# Patient Record
Sex: Male | Born: 1960
Health system: Southern US, Community
[De-identification: ages and names within clinical notes are randomized; demographics above are authoritative.]

## PROBLEM LIST (undated history)

## (undated) DIAGNOSIS — J45909 Unspecified asthma, uncomplicated: Secondary | ICD-10-CM

## (undated) DIAGNOSIS — K649 Unspecified hemorrhoids: Secondary | ICD-10-CM

## (undated) DIAGNOSIS — I10 Essential (primary) hypertension: Secondary | ICD-10-CM

## (undated) DIAGNOSIS — J302 Other seasonal allergic rhinitis: Secondary | ICD-10-CM

## (undated) DIAGNOSIS — K219 Gastro-esophageal reflux disease without esophagitis: Secondary | ICD-10-CM

## (undated) HISTORY — PX: TONSILLECTOMY: SUR1361

## (undated) HISTORY — PX: HEMORRHOID BANDING: SHX5850

## (undated) HISTORY — DX: Gastro-esophageal reflux disease without esophagitis: K21.9

## (undated) HISTORY — PX: COLONOSCOPY: SHX174

## (undated) HISTORY — DX: Essential (primary) hypertension: I10

## (undated) HISTORY — DX: Unspecified hemorrhoids: K64.9

---

## 2012-06-24 ENCOUNTER — Emergency Department: Payer: Self-pay | Admitting: Emergency Medicine

## 2015-11-01 ENCOUNTER — Other Ambulatory Visit: Payer: Self-pay | Admitting: Family Medicine

## 2015-11-01 DIAGNOSIS — M25461 Effusion, right knee: Secondary | ICD-10-CM

## 2015-11-03 ENCOUNTER — Ambulatory Visit
Admission: RE | Admit: 2015-11-03 | Discharge: 2015-11-03 | Disposition: A | Discharge status: Home / Self Care | Payer: BLUE CROSS/BLUE SHIELD | Source: Ambulatory Visit | Attending: Family Medicine | Admitting: Family Medicine

## 2015-11-03 DIAGNOSIS — M25469 Effusion, unspecified knee: Secondary | ICD-10-CM | POA: Diagnosis present

## 2015-11-03 DIAGNOSIS — M25461 Effusion, right knee: Secondary | ICD-10-CM

## 2015-12-26 ENCOUNTER — Encounter: Payer: Self-pay | Admitting: *Deleted

## 2015-12-26 ENCOUNTER — Other Ambulatory Visit: Payer: BLUE CROSS/BLUE SHIELD

## 2015-12-26 NOTE — Patient Instructions (Signed)
  Your procedure is scheduled on: 12-29-15 Report to Same Day Surgery 2nd floor medical mall To find out your arrival time please call [phone] between 1PM - 3PM on 12-28-15  Remember: Instructions that are not followed completely may result in serious medical risk, up to and including death, or upon the discretion of your surgeon and anesthesiologist your surgery may need to be rescheduled.    _x___ 1. Do not eat food or drink liquids after midnight. No gum chewing or hard candies.     __x__ 2. No Alcohol for 24 hours before or after surgery.   __x__3. No Smoking for 24 prior to surgery.   ____  4. Bring all medications with you on the day of surgery if instructed.    __x__ 5. Notify your doctor if there is any change in your medical condition     (cold, fever, infections).     Do not wear jewelry, make-up, hairpins, clips or nail polish.  Do not wear lotions, powders, or perfumes. You may wear deodorant.  Do not shave 48 hours prior to surgery. Men may shave face and neck.  Do not bring valuables to the hospital.    [facility] is not responsible for any belongings or valuables.               Contacts, dentures or bridgework may not be worn into surgery.  Leave your suitcase in the car. After surgery it may be brought to your room.  For patients admitted to the hospital, discharge time is determined by your treatment team.   Patients discharged the day of surgery will not be allowed to drive home.    Please read over the following fact sheets that you were given:   [facility] Preparing for Surgery and or MRSA Information   ____ Take these medicines the morning of surgery with A SIP OF WATER:    1. NONE  2.  3.  4.  5.  6.  ____ Fleet Enema (as directed)   ____ Use CHG Soap or sage wipes as directed on instruction sheet   ____ Use inhalers on the day of surgery and bring to hospital day of surgery  ____ Stop metformin 2 days prior to surgery    ____ Take 1/2 of  usual insulin dose the night before surgery and none on the morning of surgery.   ____ Stop aspirin or coumadin, or plavix  _x__ Stop Anti-inflammatories such as MELCOXICAM, Advil, Aleve, Ibuprofen, Motrin, Naproxen,          Naprosyn, Goodies powders or aspirin products NOW.  Ok to take Tylenol.   ____ Stop supplements until after surgery.    ____ Bring C-Pap to the hospital.

## 2015-12-29 ENCOUNTER — Ambulatory Visit: Payer: BLUE CROSS/BLUE SHIELD | Admitting: Anesthesiology

## 2015-12-29 ENCOUNTER — Ambulatory Visit
Admission: RE | Admit: 2015-12-29 | Discharge: 2015-12-29 | Disposition: A | Discharge status: Home / Self Care | Payer: BLUE CROSS/BLUE SHIELD | Source: Ambulatory Visit | Attending: Orthopedic Surgery | Admitting: Orthopedic Surgery

## 2015-12-29 ENCOUNTER — Encounter
Admission: RE | Disposition: A | Discharge status: Home / Self Care | Payer: Self-pay | Source: Ambulatory Visit | Attending: Orthopedic Surgery

## 2015-12-29 DIAGNOSIS — Z791 Long term (current) use of non-steroidal anti-inflammatories (NSAID): Secondary | ICD-10-CM | POA: Insufficient documentation

## 2015-12-29 DIAGNOSIS — M1990 Unspecified osteoarthritis, unspecified site: Secondary | ICD-10-CM | POA: Diagnosis not present

## 2015-12-29 DIAGNOSIS — Z8709 Personal history of other diseases of the respiratory system: Secondary | ICD-10-CM | POA: Diagnosis not present

## 2015-12-29 DIAGNOSIS — Z8049 Family history of malignant neoplasm of other genital organs: Secondary | ICD-10-CM | POA: Insufficient documentation

## 2015-12-29 DIAGNOSIS — Z9889 Other specified postprocedural states: Secondary | ICD-10-CM | POA: Diagnosis not present

## 2015-12-29 DIAGNOSIS — M7041 Prepatellar bursitis, right knee: Secondary | ICD-10-CM | POA: Diagnosis not present

## 2015-12-29 HISTORY — DX: Unspecified asthma, uncomplicated: J45.909

## 2015-12-29 HISTORY — PX: KNEE BURSECTOMY: SHX5882

## 2015-12-29 HISTORY — DX: Other seasonal allergic rhinitis: J30.2

## 2015-12-29 SURGERY — BURSECTOMY, KNEE
Anesthesia: General | Laterality: Right | Wound class: Clean

## 2015-12-29 MED ORDER — BUPIVACAINE HCL (PF) 0.5 % IJ SOLN
INTRAMUSCULAR | Status: AC
  Filled 2015-12-29: qty 30

## 2015-12-29 MED ORDER — LACTATED RINGERS IV SOLN
INTRAVENOUS | Status: DC | PRN
  Administered 2015-12-29: 13:00:00 via INTRAVENOUS

## 2015-12-29 MED ORDER — HYDROCODONE-ACETAMINOPHEN 5-325 MG PO TABS: 1.0000 | ORAL_TABLET | Freq: Four times a day (QID) | ORAL | Status: DC | PRN

## 2015-12-29 MED ORDER — CEFAZOLIN IN D5W 1 GM/50ML IV SOLN
INTRAVENOUS | Status: AC
  Filled 2015-12-29: qty 50

## 2015-12-29 MED ORDER — FENTANYL CITRATE (PF) 100 MCG/2ML IJ SOLN
INTRAMUSCULAR | Status: AC
  Filled 2015-12-29: qty 2

## 2015-12-29 MED ORDER — CEFAZOLIN IN D5W 1 GM/50ML IV SOLN
1.0000 g | Freq: Once | INTRAVENOUS | Status: DC
Start: 2015-12-29 — End: 2015-12-29

## 2015-12-29 MED ORDER — LACTATED RINGERS IV SOLN
INTRAVENOUS | Status: DC
  Administered 2015-12-29: 12:00:00 via INTRAVENOUS

## 2015-12-29 MED ORDER — PROPOFOL 10 MG/ML IV BOLUS
INTRAVENOUS | Status: DC | PRN
  Administered 2015-12-29: 50 mg via INTRAVENOUS
  Administered 2015-12-29: 200 mg via INTRAVENOUS

## 2015-12-29 MED ORDER — ONDANSETRON HCL 4 MG/2ML IJ SOLN
INTRAMUSCULAR | Status: DC | PRN
  Administered 2015-12-29: 4 mg via INTRAVENOUS

## 2015-12-29 MED ORDER — FENTANYL CITRATE (PF) 100 MCG/2ML IJ SOLN
INTRAMUSCULAR | Status: DC | PRN
  Administered 2015-12-29 (×2): 50 ug via INTRAVENOUS

## 2015-12-29 MED ORDER — PROMETHAZINE HCL 25 MG/ML IJ SOLN
6.2500 mg | INTRAMUSCULAR | Status: DC | PRN
Start: 2015-12-29 — End: 2015-12-29

## 2015-12-29 MED ORDER — HYDROCODONE-ACETAMINOPHEN 5-325 MG PO TABS
1.0000 | ORAL_TABLET | Freq: Four times a day (QID) | ORAL | Status: DC | PRN
Start: 2015-12-29 — End: 2015-12-29
  Administered 2015-12-29: 1 via ORAL

## 2015-12-29 MED ORDER — MIDAZOLAM HCL 2 MG/2ML IJ SOLN
INTRAMUSCULAR | Status: DC | PRN
  Administered 2015-12-29: 2 mg via INTRAVENOUS

## 2015-12-29 MED ORDER — FAMOTIDINE 20 MG PO TABS
20.0000 mg | ORAL_TABLET | Freq: Once | ORAL | Status: AC
  Administered 2015-12-29: 20 mg via ORAL

## 2015-12-29 MED ORDER — FENTANYL CITRATE (PF) 100 MCG/2ML IJ SOLN
25.0000 ug | INTRAMUSCULAR | Status: DC | PRN
  Administered 2015-12-29 (×3): 50 ug via INTRAVENOUS

## 2015-12-29 MED ORDER — FAMOTIDINE 20 MG PO TABS
ORAL_TABLET | ORAL | Status: AC
  Administered 2015-12-29: 20 mg via ORAL
  Filled 2015-12-29: qty 1

## 2015-12-29 MED ORDER — HYDROCODONE-ACETAMINOPHEN 5-325 MG PO TABS
ORAL_TABLET | ORAL | Status: AC
  Filled 2015-12-29: qty 1

## 2015-12-29 SURGICAL SUPPLY — 41 items
BANDAGE ELASTIC 6 LF NS (GAUZE/BANDAGES/DRESSINGS) ×2 IMPLANT
CANISTER SUCT 1200ML W/VALVE (MISCELLANEOUS) ×2 IMPLANT
CATH IV ANGIO 16GX3.25 GREY (CATHETERS) ×1 IMPLANT
CHLORAPREP W/TINT 26ML (MISCELLANEOUS) ×4 IMPLANT
CUFF TOURN 24 STER (MISCELLANEOUS) IMPLANT
CUFF TOURN 30 STER DUAL PORT (MISCELLANEOUS) ×2 IMPLANT
DRAPE C-ARM XRAY 36X54 (DRAPES) IMPLANT
DRAPE C-ARMOR (DRAPES) IMPLANT
ELECT CAUTERY BLADE 6.4 (BLADE) ×2 IMPLANT
ELECT REM PT RETURN 9FT ADLT (ELECTROSURGICAL)
ELECTRODE REM PT RTRN 9FT ADLT (ELECTROSURGICAL) IMPLANT
GAUZE PETRO XEROFOAM 1X8 (MISCELLANEOUS) ×2 IMPLANT
GAUZE SPONGE 4X4 12PLY STRL (GAUZE/BANDAGES/DRESSINGS) ×2 IMPLANT
GLOVE BIOGEL PI IND STRL 9 (GLOVE) ×1 IMPLANT
GLOVE BIOGEL PI INDICATOR 9 (GLOVE) ×1
GLOVE SURG ORTHO 9.0 STRL STRW (GLOVE) ×2 IMPLANT
GOWN SRG 2XL LVL 4 RGLN SLV (GOWNS) ×1 IMPLANT
GOWN STRL NON-REIN 2XL LVL4 (GOWNS) ×1
GOWN STRL REUS W/ TWL LRG LVL3 (GOWN DISPOSABLE) ×1 IMPLANT
GOWN STRL REUS W/TWL LRG LVL3 (GOWN DISPOSABLE) ×1
HANDLE YANKAUER SUCT BULB TIP (MISCELLANEOUS) ×2 IMPLANT
HEMOVAC 400CC 10FR (MISCELLANEOUS) IMPLANT
IMMOB KNEE 24 THIGH 24 443303 (SOFTGOODS) IMPLANT
IV CATH ANGIO 16GX3.25 GREY (CATHETERS) ×2
KIT PREVENA INCISION MGT20CM45 (CANNISTER) ×2 IMPLANT
KIT RM TURNOVER STRD PROC AR (KITS) ×2 IMPLANT
NS IRRIG 500ML POUR BTL (IV SOLUTION) ×2 IMPLANT
PACK TOTAL KNEE (MISCELLANEOUS) IMPLANT
PAD ABD DERMACEA PRESS 5X9 (GAUZE/BANDAGES/DRESSINGS) ×2 IMPLANT
STAPLER SKIN PROX 35W (STAPLE) IMPLANT
STRAP SAFETY BODY (MISCELLANEOUS) ×2 IMPLANT
SUT FIBERWIRE #5 38 CONV BLUE (SUTURE)
SUT ORTHOCORD W/MULTIPK NDL (SUTURE) ×2 IMPLANT
SUT STEEL 7 (SUTURE) ×2 IMPLANT
SUT VIC AB 0 CT1 27 (SUTURE)
SUT VIC AB 0 CT1 27XCR 8 STRN (SUTURE) IMPLANT
SUT VIC AB 0 CT1 36 (SUTURE) IMPLANT
SUT VIC AB 2-0 CT1 27 (SUTURE)
SUT VIC AB 2-0 CT1 TAPERPNT 27 (SUTURE) IMPLANT
SUTURE FIBERWR #5 38 CONV BLUE (SUTURE) IMPLANT
SYRINGE 10CC LL (SYRINGE) ×2 IMPLANT

## 2015-12-29 NOTE — Discharge Instructions (Addendum)
Minimize activities as well as bending the knee through this weekend until recheck. Keep the leg out straight elevated on pillows as much as possible. Pain medicine as directed.        General Anesthesia, Adult General anesthesia is a sleep-like state of non-feeling produced by medicines (anesthetics). General anesthesia prevents you from being alert and feeling pain during a medical procedure. Your caregiver may recommend general anesthesia if your procedure:  Is long.  Is painful or uncomfortable.  Would be frightening to see or hear.  Requires you to be still.  Affects your breathing.  Causes significant blood loss. LET YOUR CAREGIVER KNOW ABOUT:  Allergies to food or medicine.  Medicines taken, including vitamins, herbs, eyedrops, over-the-counter medicines, and creams.  Use of steroids (by mouth or creams).  Previous problems with anesthetics or numbing medicines, including problems experienced by relatives.  History of bleeding problems or blood clots.  Previous surgeries and types of anesthetics received.  Possibility of pregnancy, if this applies.  Use of cigarettes, alcohol, or illegal drugs.  Any health condition(s), especially diabetes, sleep apnea, and high blood pressure. RISKS AND COMPLICATIONS General anesthesia rarely causes complications. However, if complications do occur, they can be life threatening. Complications include:  A lung infection.  A stroke.  A heart attack.  Waking up during the procedure. When this occurs, the patient may be unable to move and communicate that he or she is awake. The patient may feel severe pain. Older adults and adults with serious medical problems are more likely to have complications than adults who are young and healthy. Some complications can be prevented by answering all of your caregiver's questions thoroughly and by following all pre-procedure instructions. It is important to tell your caregiver if any of the  pre-procedure instructions, especially those related to diet, were not followed. Any food or liquid in the stomach can cause problems when you are under general anesthesia. BEFORE THE PROCEDURE  Ask your caregiver if you will have to spend the night at the hospital. If you will not have to spend the night, arrange to have an adult drive you and stay with you for 24 hours.  Follow your caregiver's instructions if you are taking dietary supplements or medicines. Your caregiver may tell you to stop taking them or to reduce your dosage.  Do not smoke for as long as possible before your procedure. If possible, stop smoking 3-6 weeks before the procedure.  Do not take new dietary supplements or medicines within 1 week of your procedure unless your caregiver approves them.  Do not eat within 8 hours of your procedure or as directed by your caregiver. Drink only clear liquids, such as water, black coffee (without milk or cream), and fruit juices (without pulp).  Do not drink within 3 hours of your procedure or as directed by your caregiver.  You may brush your teeth on the morning of the procedure, but make sure to spit out the toothpaste and water when finished. PROCEDURE  You will receive anesthetics through a mask, through an intravenous (IV) access tube, or through both. A doctor who specializes in anesthesia (anesthesiologist) or a nurse who specializes in anesthesia (nurse anesthetist) or both will stay with you throughout the procedure to make sure you remain unconscious. He or she will also watch your blood pressure, pulse, and oxygen levels to make sure that the anesthetics do not cause any problems. Once you are asleep, a breathing tube or mask may be used to help  you breathe. AFTER THE PROCEDURE You will wake up after the procedure is complete. You may be in the room where the procedure was performed or in a recovery area. You may have a sore throat if a breathing tube was used. You may also  feel:  Dizzy.  Weak.  Drowsy.  Confused.  Nauseous.  Cold. These are all normal responses and can be expected to last for up to 24 hours after the procedure is complete. A caregiver will tell you when you are ready to go home. This will usually be when you are fully awake and in stable condition.   This information is not intended to replace advice given to you by your health care provider. Make sure you discuss any questions you have with your health care provider.   Document Released: 09/11/2007 Document Revised: 06/25/2014 Document Reviewed: 10/03/2011 Elsevier Interactive Patient Education [employer].

## 2015-12-29 NOTE — Anesthesia Procedure Notes (Signed)
Procedure Name: LMA Insertion Date/Time: 12/29/2015 1:04 PM Performed by: Jennette Bill Pre-anesthesia Checklist: Patient identified, Suction available, Emergency Drugs available, Patient being monitored and Timeout performed Patient Re-evaluated:Patient Re-evaluated prior to inductionOxygen Delivery Method: Circle system utilized Preoxygenation: Pre-oxygenation with 100% oxygen Intubation Type: IV induction Ventilation: Mask ventilation without difficulty LMA: LMA inserted LMA Size: 4.5 Number of attempts: 1

## 2015-12-29 NOTE — H&P (Signed)
Reviewed paper H+P, will be scanned into chart. No changes noted.  

## 2015-12-29 NOTE — Anesthesia Preprocedure Evaluation (Signed)
Anesthesia Evaluation  Patient identified by MRN, date of birth, ID band Patient awake    Reviewed: Allergy & Precautions, H&P , NPO status , Patient's Chart, lab work & pertinent test results, reviewed documented beta blocker date and time   Airway Mallampati: III  TM Distance: >3 FB Neck ROM: full    Dental no notable dental hx. (+) Partial Upper, Partial Lower, Missing   Pulmonary neg shortness of breath, asthma , neg sleep apnea, neg COPD, neg recent URI,    Pulmonary exam normal breath sounds clear to auscultation       Cardiovascular Exercise Tolerance: Good negative cardio ROS Normal cardiovascular exam Rhythm:regular Rate:Normal     Neuro/Psych negative neurological ROS  negative psych ROS   GI/Hepatic negative GI ROS, Neg liver ROS,   Endo/Other  diabetes (Borderline)  Renal/GU negative Renal ROS  negative genitourinary   Musculoskeletal   Abdominal   Peds  Hematology negative hematology ROS (+)   Anesthesia Other Findings Past Medical History:   Seasonal allergies                                           Asthma                                                         Comment:H/O 20 YEARS AGO-NO INHALERS   Reproductive/Obstetrics negative OB ROS                             Anesthesia Physical Anesthesia Plan  ASA: II  Anesthesia Plan: General   Post-op Pain Management:    Induction:   Airway Management Planned:   Additional Equipment:   Intra-op Plan:   Post-operative Plan:   Informed Consent: I have reviewed the patients History and Physical, chart, labs and discussed the procedure including the risks, benefits and alternatives for the proposed anesthesia with the patient or authorized representative who has indicated his/her understanding and acceptance.   Dental Advisory Given  Plan Discussed with: Anesthesiologist, CRNA and Surgeon  Anesthesia Plan  Comments:         Anesthesia Quick Evaluation

## 2015-12-29 NOTE — Transfer of Care (Signed)
Immediate Anesthesia Transfer of Care Note  Patient: Randy Santiago  Procedure(s) Performed: Procedure(s): KNEE BURSECTOMY (Right)  Patient Location: PACU  Anesthesia Type:General  Level of Consciousness: awake, alert  and oriented  Airway & Oxygen Therapy: Patient Spontanous Breathing and Patient connected to face mask oxygen  Post-op Assessment: Report given to RN and Post -op Vital signs reviewed and stable  Post vital signs: Reviewed and stable  Last Vitals:  Filed Vitals:   12/29/15 1122 12/29/15 1347  BP: 149/81 151/96  Pulse: 65   Temp: 36.4 C 36.4 C  Resp: 16     Last Pain:  Filed Vitals:   12/29/15 1355  PainSc: 8       Patients Stated Pain Goal: 0 ([id number])  Complications: No apparent anesthesia complications

## 2015-12-29 NOTE — Op Note (Signed)
12/29/2015  1:45 PM  PATIENT:  Randy Santiago  55 y.o. male  PRE-OPERATIVE DIAGNOSIS:  PREPATELLA BURSITIS  POST-OPERATIVE DIAGNOSIS:  PREPATELLA BURSITIS  PROCEDURE:  Procedure(s): KNEE BURSECTOMY (Right)  SURGEON: Laurene Footman, MD  ASSISTANTS: None  ANESTHESIA:   general  EBL:     BLOOD ADMINISTERED:none  DRAINS: Nu Gauze left as a wick with a PrOVENa wound VAC over incisions   LOCAL MEDICATIONS USED:  NONE  SPECIMEN:  No Specimen  DISPOSITION OF SPECIMEN:  N/A  COUNTS:  YES  TOURNIQUET:    IMPLANTS: None  DICTATION: .Dragon Dictation patient was brought to the operating room and after adequate general anesthesia was obtained, the right leg was prepped and draped in sterile fashion. After appropriate patient identification and timeout procedures were completed, superior lateral and inferior medial incisions approximately a centimeter in length were made. Cannulas were inserted and approximately 3 L of fluid irrigated through the wound with clear bursal fluid noted initially along with some debris consistent with bursitis. A curet was also used to abrade the inside of the bursa trying to get this bursal tissue to irrigate out. After thorough irrigation Nu Gauze was inserted into the 2 incisions as a wick and a wound VAC applied to suction at the incisions and try to prevent recurrence. Web roll and an Ace wrap were also applied to help prevent re-formation of the bursal fluid collection.  PLAN OF CARE: Discharge to home after PACU  PATIENT DISPOSITION:  PACU - hemodynamically stable.

## 2015-12-30 ENCOUNTER — Encounter: Payer: Self-pay | Admitting: Orthopedic Surgery

## 2016-01-01 NOTE — Anesthesia Postprocedure Evaluation (Signed)
Anesthesia Post Note  Patient: Ida Venkataraman  Procedure(s) Performed: Procedure(s) (LRB): KNEE BURSECTOMY (Right)  Patient location during evaluation: PACU Anesthesia Type: General Level of consciousness: awake and alert Pain management: pain level controlled Vital Signs Assessment: post-procedure vital signs reviewed and stable Respiratory status: spontaneous breathing, nonlabored ventilation, respiratory function stable and patient connected to nasal cannula oxygen Cardiovascular status: blood pressure returned to baseline and stable Postop Assessment: no signs of nausea or vomiting Anesthetic complications: no    Last Vitals:  Filed Vitals:   12/29/15 1443 12/29/15 1524  BP: 158/92 135/68  Pulse: 52 55  Temp: 36.3 C   Resp: 16 16    Last Pain:  Filed Vitals:   12/30/15 0817  PainSc: 2                  Martha Clan

## 2016-04-12 ENCOUNTER — Ambulatory Visit: Payer: BLUE CROSS/BLUE SHIELD | Attending: Otolaryngology

## 2016-04-12 DIAGNOSIS — R0683 Snoring: Secondary | ICD-10-CM | POA: Insufficient documentation

## 2017-01-12 IMAGING — CR DG KNEE 3 VIEWS*R*
4 series · 4 of 4 positions shown · non-contrast
Comparison: No prior.

CLINICAL DATA: Chronic right knee pain.

EXAM:
RIGHT KNEE - 3 VIEW

[knee ap (1 of 2)]
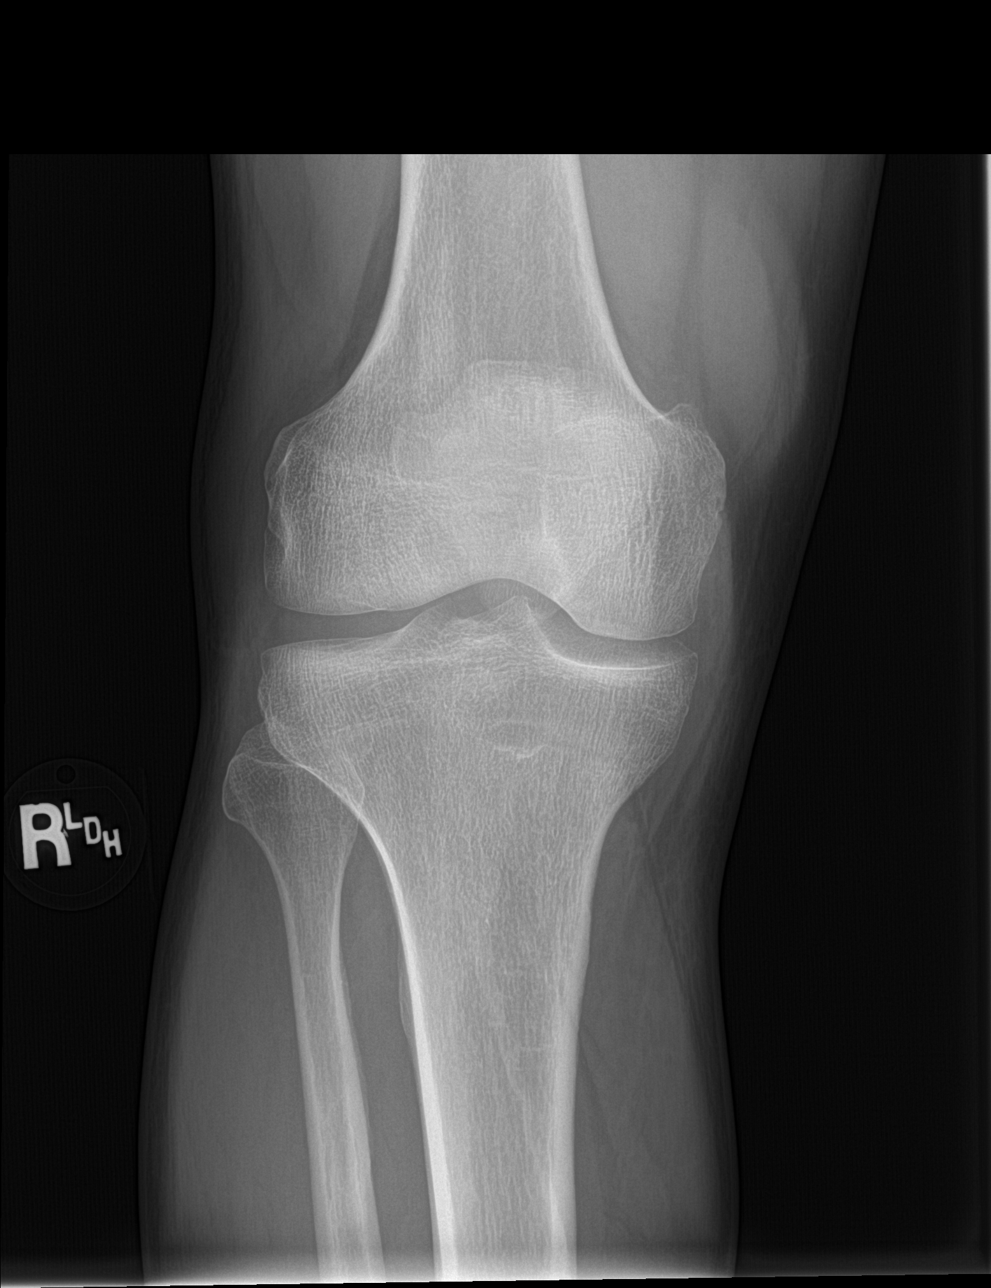

[knee lat]
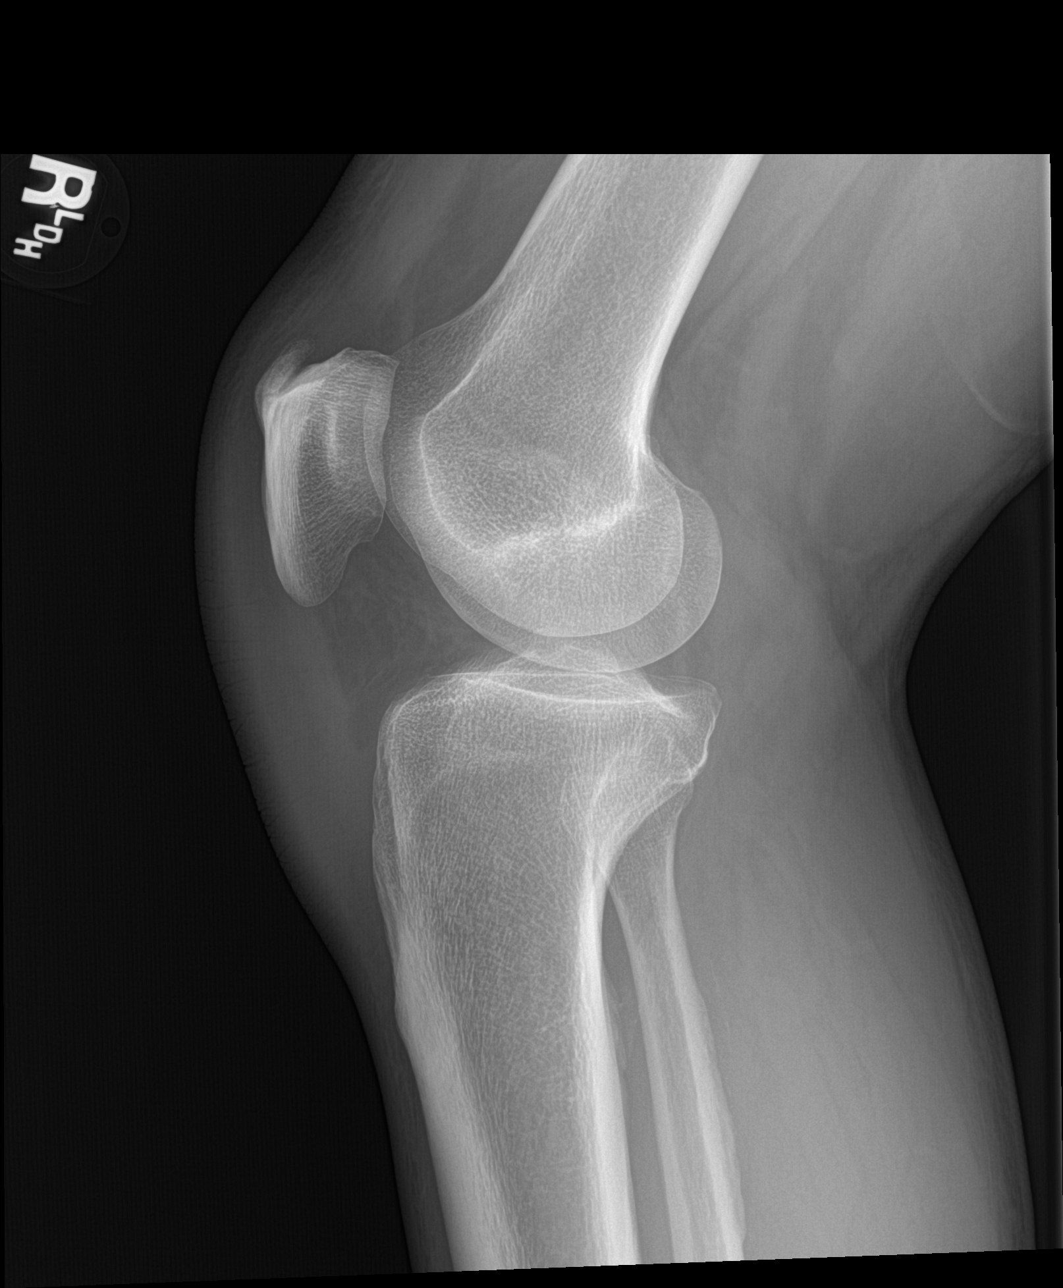

[sunrise]
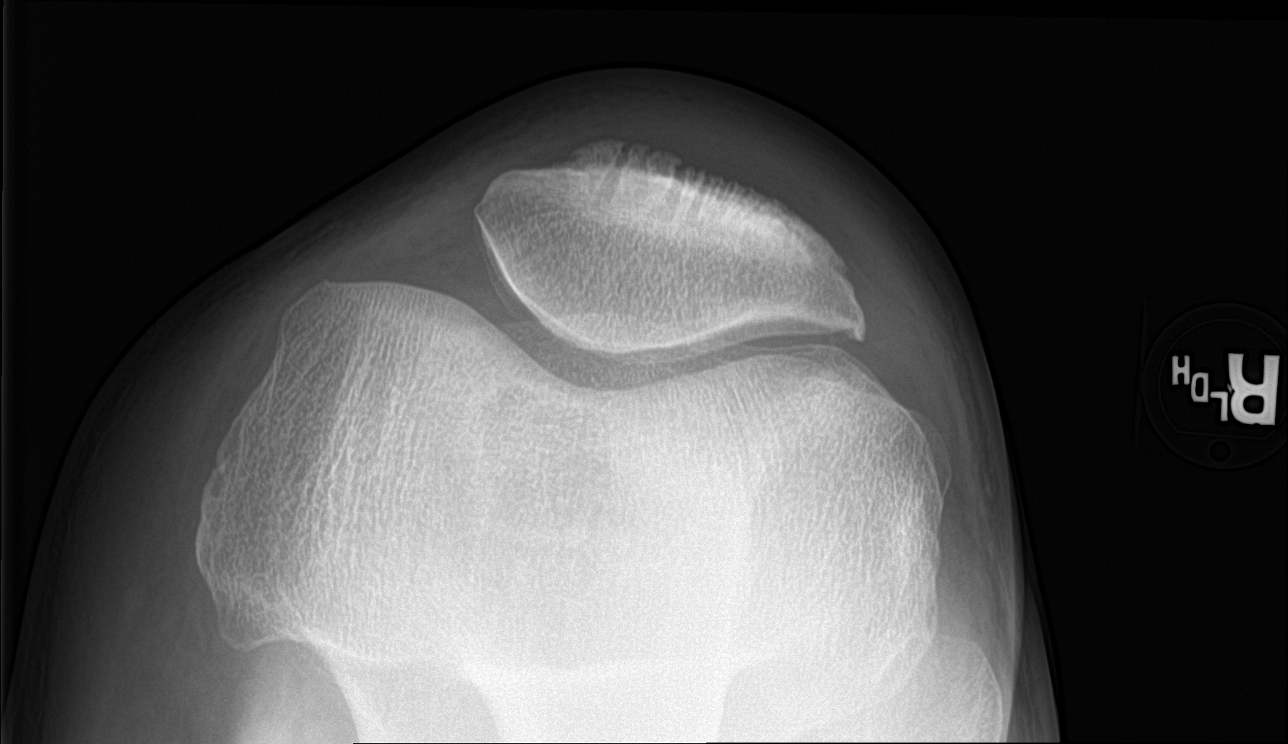

[knee ap (2 of 2)]
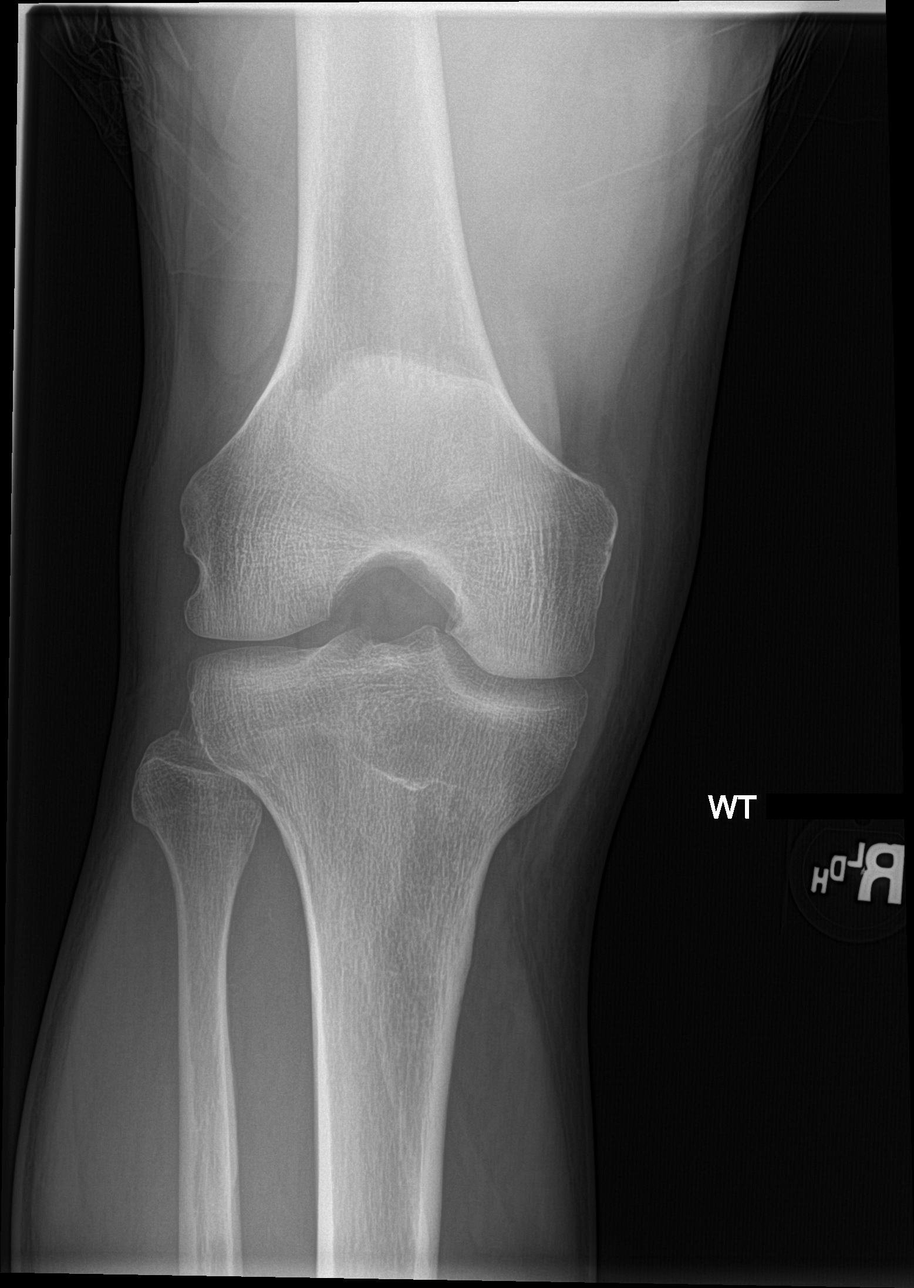

[4 of 4 positions shown; findings below may reference images not displayed]

FINDINGS: Mild patellofemoral and medial compartment degenerative change. No
evidence of fracture dislocation. Tiny effusion cannot be excluded.
IMPRESSION: 1. Tiny effusion cannot be excluded. Mild patellofemoral medial
compartment degenerative change.

2. No acute bony abnormality P

## 2017-06-19 DIAGNOSIS — R1906 Epigastric swelling, mass or lump: Secondary | ICD-10-CM | POA: Diagnosis not present

## 2017-06-19 DIAGNOSIS — K439 Ventral hernia without obstruction or gangrene: Secondary | ICD-10-CM | POA: Diagnosis not present

## 2017-06-19 DIAGNOSIS — R1900 Intra-abdominal and pelvic swelling, mass and lump, unspecified site: Secondary | ICD-10-CM | POA: Diagnosis not present

## 2017-07-11 ENCOUNTER — Encounter: Payer: Self-pay | Admitting: Surgery

## 2017-07-11 ENCOUNTER — Ambulatory Visit (INDEPENDENT_AMBULATORY_CARE_PROVIDER_SITE_OTHER): Payer: 59 | Admitting: Surgery

## 2017-07-11 VITALS — BP 154/82 | HR 69 | Temp 98.3°F | Ht 66.0 in | Wt 198.0 lb

## 2017-07-11 DIAGNOSIS — K439 Ventral hernia without obstruction or gangrene: Secondary | ICD-10-CM | POA: Diagnosis not present

## 2017-07-11 DIAGNOSIS — M6208 Separation of muscle (nontraumatic), other site: Secondary | ICD-10-CM

## 2017-07-11 DIAGNOSIS — K219 Gastro-esophageal reflux disease without esophagitis: Secondary | ICD-10-CM | POA: Diagnosis not present

## 2017-07-11 DIAGNOSIS — Z7689 Persons encountering health services in other specified circumstances: Secondary | ICD-10-CM | POA: Diagnosis not present

## 2017-07-11 DIAGNOSIS — G479 Sleep disorder, unspecified: Secondary | ICD-10-CM | POA: Diagnosis not present

## 2017-07-11 NOTE — Progress Notes (Signed)
Randy Santiago is an 57 y.o. male.   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  He has a hernia or a perceived hernia.  He was referred by his primary care physician. Patient describes a bulge in his upper abdomen that does not cause him any pain he noticed it 3 weeks ago he has no fevers chills no change in bowel habits and no nausea or vomiting.  He has never had abdominal surgery. Past Medical History:  Diagnosis Date  . Asthma    H/O 20 YEARS AGO-NO INHALERS  . Seasonal allergies     Past Surgical History:  Procedure Laterality Date  . COLONOSCOPY    . HEMORRHOID BANDING    . KNEE BURSECTOMY Right 12/29/2015   Procedure: KNEE BURSECTOMY;  Surgeon: Hessie Knows, MD;  Location: [facility] ORS;  Service: Orthopedics;  Laterality: Right;  . TONSILLECTOMY      No family history on file. Social History:  reports that  has never smoked. He does not have any smokeless tobacco history on file. He reports that he does not drink alcohol or use drugs.  Allergies: No Known Allergies   (Not in a hospital admission)   Review of Systems:   Review of Systems  Constitutional: Negative.   HENT: Negative.   Eyes: Negative.   Respiratory: Negative.   Cardiovascular: Negative.   Gastrointestinal: Negative for abdominal pain, constipation, diarrhea, heartburn, nausea and vomiting.  Genitourinary: Negative.   Musculoskeletal: Negative.   Skin: Negative.   Neurological: Negative.   Endo/Heme/Allergies: Negative.   Psychiatric/Behavioral: Negative.     Physical Exam:  Physical Exam  Constitutional: He is oriented to person, place, and time and well-developed, well-nourished, and in no distress. No distress.  HENT:  Head: Normocephalic and atraumatic.  Eyes: Pupils are equal, round, and reactive to light. Right eye exhibits no discharge. Left eye exhibits no discharge. No scleral icterus.  Neck: Normal range of motion. No JVD present.  Cardiovascular: Normal  rate, regular rhythm and normal heart sounds.  Pulmonary/Chest: Effort normal and breath sounds normal. No respiratory distress. He has no wheezes.  Abdominal: Soft. He exhibits no distension. There is no tenderness. There is no rebound.  Rectus diastases, no hernia  Musculoskeletal: Normal range of motion. He exhibits no edema or tenderness.  Lymphadenopathy:    He has no cervical adenopathy.  Neurological: He is alert and oriented to person, place, and time.  Skin: Skin is warm and dry. No rash noted. He is not diaphoretic. No erythema.  Psychiatric: Mood and affect normal.  Vitals reviewed.   There were no vitals taken for this visit.    No results found for this or any previous visit (from the past 48 hour(s)). No results found.   Assessment/Plan  Rectus diastases no obvious hernia.  Discussed with the patient the pathophysiology of rectus diastases and the no need for surgery.  He understood and will follow-up on an as-needed basis  Florene Glen, MD, FACS

## 2017-07-11 NOTE — Patient Instructions (Signed)
Diastasis Recti Diastasis recti is when the muscles of the abdomen (rectus abdominis muscles) become thin and separate. The result is a wider space between the right and left abdomen (abdominal) muscles. This wider space between the muscles may cause a bulge in the middle of your abdomen. You may notice this bulge when you are straining or when you sit up from a lying down position. Diastasis recti can affect men and women. It is most common among pregnant women, infants, people who are obese, and people who have had abdominal surgery. Exercise or surgical treatment may help correct it. What are the causes? Common causes of this condition include:  Pregnancy. The growing uterus puts pressure on the abdominal muscles, which causes the muscles to separate.  Obesity. Excess fat puts pressure on abdominal muscles.  Weightlifting.  Some abdomen exercises.  Advanced age.  Genetics.  Prior abdominal surgery.  What increases the risk? This condition is more likely to develop in:  Women.  Newborns, especially newborns who are born early (prematurely).  What are the signs or symptoms? Common symptoms of this condition include:  A bulge in the middle of the abdomen. You will notice it most when you sit up or strain.  Pain in the low back, pelvis, or hips.  Constipation.  Inability to control when you urinate (urinary incontinence).  Bloating.  Poor posture.  How is this diagnosed? This condition is diagnosed with a physical exam. Your health care provider will ask you to lie flat on your back and do a crunch or half sit-up. If you have diastasis recti, a vertical bulge will appear between your abdominal muscles in the center of your abdomen. Your health care provider will measure the gap between your muscles with one of the following:  A medical device used to measure the space between two objects (caliper).  A tape measure.  CT scan.  Ultrasound.  Finger spaces. Your health  care provider will measure the space using their fingers.  How is this treated? If your muscle separation is not too large, you may not need treatment. However, if you are a woman who plans to become pregnant again, you should treat this condition before your next pregnancy. Treatment may include:  Physical therapy to strengthen and tighten your abdominal muscles.  Lifestyle changes such as weight loss and exercise.  Over-the-counter pain medicines as needed.  Surgery to correct the separation.  Follow these instructions at home: Activity  Return to your normal activities as told by your health care provider. Ask your health care provider what activities are safe for you.  When lifting weights or doing exercises using your abdominal muscles or the muscles in the center of your body that give stability (core muscles), make sure you are doing your exercises and movements correctly. Proper form can help to prevent the condition from happening again. General instructions  If you are overweight, ask your health care provider for help with weight loss. Losing even a small amount of weight can help to improve your diastasis recti.  Take over-the-counter or prescription medicines only as told by your health care provider.  Do not strain. Straining can make the separation worse. Examples of straining include: ? Pushing hard to have a bowel movement, such as due to constipation. ? Lifting heavy objects, including children. ? Standing up and sitting down.  Take steps to prevent constipation: ? Drink enough fluid to keep your urine clear or pale yellow. ? Take over-the-counter or prescription medicines only as   directed. ? Eat foods that are high in fiber, such as fresh fruits and vegetables, whole grains, and beans. ? Limit foods that are high in fat and processed sugars, such as fried and sweet foods. Contact a health care provider if:  You notice a new bulge in your abdomen. Get help  right away if:  You experience severe discomfort in your abdomen.  You develop severe abdominal pain along with nausea, vomiting, or fever. Summary  Diastasis recti is when the abdomen (abdominal) muscles become thin and separate. Your abdomen will stick out because the space between your right and left abdomen muscles has widened.  The most common symptom is a bulge in your abdomen. You will notice it most when you sit up or are straining.  This condition is diagnosed during a physical exam.  If the abdomen separation is not too big, you may choose not to have treatment. Otherwise, you may need to undergo physical therapy or surgery. This information is not intended to replace advice given to you by your health care provider. Make sure you discuss any questions you have with your health care provider. Document Released: 07/30/2016 Document Revised: 07/30/2016 Document Reviewed: 07/30/2016 Elsevier Interactive Patient Education  2018 [employer]  

## 2017-09-04 DIAGNOSIS — M6208 Separation of muscle (nontraumatic), other site: Secondary | ICD-10-CM | POA: Diagnosis not present

## 2017-09-04 DIAGNOSIS — M4807 Spinal stenosis, lumbosacral region: Secondary | ICD-10-CM | POA: Diagnosis not present

## 2017-09-04 DIAGNOSIS — M545 Low back pain: Secondary | ICD-10-CM | POA: Diagnosis not present

## 2017-09-04 DIAGNOSIS — J3489 Other specified disorders of nose and nasal sinuses: Secondary | ICD-10-CM | POA: Diagnosis not present

## 2019-07-07 ENCOUNTER — Other Ambulatory Visit: Payer: Self-pay

## 2019-07-07 ENCOUNTER — Ambulatory Visit
Admission: EM | Admit: 2019-07-07 | Discharge: 2019-07-07 | Disposition: A | Discharge status: Home / Self Care | Payer: 59 | Attending: Emergency Medicine | Admitting: Emergency Medicine

## 2019-07-07 DIAGNOSIS — J069 Acute upper respiratory infection, unspecified: Secondary | ICD-10-CM

## 2019-07-07 MED ORDER — ALBUTEROL SULFATE HFA 108 (90 BASE) MCG/ACT IN AERS: 2.0000 | INHALATION_SPRAY | RESPIRATORY_TRACT | 0 refills | Status: DC | PRN

## 2019-07-07 NOTE — ED Triage Notes (Signed)
Pt states he has had sinus congestion and a headache on & off for 2 weeks now, he has taken Sudafed for sinus to relieve discomfort.

## 2019-07-07 NOTE — Discharge Instructions (Addendum)
Use the albuterol inhaler as directed.  Additionally use plain Mucinex for sinus congestion.    Follow-up with your primary care provider if your symptoms are not improving.

## 2019-07-07 NOTE — ED Provider Notes (Signed)
Randy Santiago    CSN: [id number] Arrival date & time: 07/07/19  1017      History   Chief Complaint Chief Complaint  Patient presents with  . Sinus Problem    HPI Randy Santiago is a 59 y.o. male.   Patient presents with sinus congestion and sinus headache intermittently x2 weeks.  He states he chronically has allergies, sinus issues, asthma.  He states his symptoms have improved and resolved today.  Previously he was taking Sudafed for his symptoms.  He denies fever, chills, sore throat, cough, shortness of breath, or other symptoms.  Patient requests a refill on his albuterol inhaler.  The history is provided by the patient.    Past Medical History:  Diagnosis Date  . Asthma    H/O 20 YEARS AGO-NO INHALERS  . GERD (gastroesophageal reflux disease)   . Seasonal allergies     There are no problems to display for this patient.   Past Surgical History:  Procedure Laterality Date  . COLONOSCOPY    . HEMORRHOID BANDING    . KNEE BURSECTOMY Right 12/29/2015   Procedure: KNEE BURSECTOMY;  Surgeon: Hessie Knows, MD;  Location: [facility] ORS;  Service: Orthopedics;  Laterality: Right;  . TONSILLECTOMY         Home Medications    Prior to Admission medications   Medication Sig Start Date End Date Taking? Authorizing Provider  albuterol (VENTOLIN HFA) 108 (90 Base) MCG/ACT inhaler Inhale 2 puffs into the lungs every 4 (four) hours as needed for wheezing or shortness of breath. 07/07/19   Sharion Balloon, NP  omeprazole (PRILOSEC) 20 MG capsule Take 1 capsule by mouth 1 day or 1 dose.    [provider]    Family History Family History  Problem Relation Age of Onset  . Cancer Mother        not sure what type  . Diabetes Sister   . Diabetes Sister   . Heart attack Sister   . Heart attack Sister     Social History Social History   Tobacco Use  . Smoking status: Never Smoker  . Smokeless tobacco: Never Used  Substance Use Topics  . Alcohol use: No  .  Drug use: No     Allergies   Patient has no known allergies.   Review of Systems Review of Systems  Constitutional: Negative for chills and fever.  HENT: Positive for congestion, postnasal drip, rhinorrhea and sinus pressure. Negative for ear pain and sore throat.   Eyes: Negative for pain and visual disturbance.  Respiratory: Negative for cough and shortness of breath.   Cardiovascular: Negative for chest pain and palpitations.  Gastrointestinal: Negative for abdominal pain and vomiting.  Genitourinary: Negative for dysuria and hematuria.  Musculoskeletal: Negative for arthralgias and back pain.  Skin: Negative for color change and rash.  Neurological: Positive for headaches. Negative for dizziness, seizures, syncope, facial asymmetry, speech difficulty, weakness and numbness.  All other systems reviewed and are negative.    Physical Exam Triage Vital Signs ED Triage Vitals  Enc Vitals Group     BP      Pulse      Resp      Temp      Temp src      SpO2      Weight      Height      Head Circumference      Peak Flow      Pain Score  Pain Loc      Pain Edu?      Excl. in [place]?    No data found.  Updated Vital Signs BP (!) 161/95 (BP Location: Right Arm)   Pulse 62   Temp 98.3 F (36.8 C) (Oral)   Resp 18   Wt 202 lb (91.6 kg)   SpO2 95%   BMI 32.60 kg/m   Visual Acuity Right Eye Distance:   Left Eye Distance:   Bilateral Distance:    Right Eye Near:   Left Eye Near:    Bilateral Near:     Physical Exam Vitals and nursing note reviewed.  Constitutional:      General: He is not in acute distress.    Appearance: He is well-developed. He is not ill-appearing.  HENT:     Head: Normocephalic and atraumatic.     Right Ear: Tympanic membrane normal.     Left Ear: Tympanic membrane normal.     Nose: Nose normal.     Mouth/Throat:     Mouth: Mucous membranes are moist.     Pharynx: Oropharynx is clear.  Eyes:     Conjunctiva/sclera: Conjunctivae  normal.  Cardiovascular:     Rate and Rhythm: Normal rate and regular rhythm.     Heart sounds: No murmur.  Pulmonary:     Effort: Pulmonary effort is normal. No respiratory distress.     Breath sounds: Normal breath sounds. No wheezing or rhonchi.  Abdominal:     General: Bowel sounds are normal.     Palpations: Abdomen is soft.     Tenderness: There is no abdominal tenderness. There is no guarding or rebound.  Musculoskeletal:     Cervical back: Neck supple.  Skin:    General: Skin is warm and dry.     Findings: No rash.  Neurological:     General: No focal deficit present.     Mental Status: He is alert and oriented to person, place, and time.  Psychiatric:        Mood and Affect: Mood normal.        Behavior: Behavior normal.      UC Treatments / Results  Labs (all labs ordered are listed, but only abnormal results are displayed) Labs Reviewed - No data to display  EKG   Radiology No results found.  Procedures Procedures (including critical care time)  Medications Ordered in UC Medications - No data to display  Initial Impression / Assessment and Plan / UC Course  I have reviewed the triage vital signs and the nursing notes.  Pertinent labs & imaging results that were available during my care of the patient were reviewed by me and considered in my medical decision making (see chart for details).    Upper respiratory infection.  Patient is currently asymptomatic.  Albuterol inhaler prescribed per patient request.  Additionally, instructed him to use plain Mucinex for his sinus congestion as needed.  Instructed him to follow-up with his PCP if his symptoms are not improving.  Patient agrees to plan of care.     Final Clinical Impressions(s) / UC Diagnoses   Final diagnoses:  Upper respiratory tract infection, unspecified type     Discharge Instructions     Use the albuterol inhaler as directed.  Additionally use plain Mucinex for sinus congestion.     Follow-up with your primary care provider if your symptoms are not improving.        ED Prescriptions    Medication Sig Dispense  Auth. Provider   albuterol (VENTOLIN HFA) 108 (90 Base) MCG/ACT inhaler Inhale 2 puffs into the lungs every 4 (four) hours as needed for wheezing or shortness of breath. 18 g Sharion Balloon, NP     PDMP not reviewed this encounter.   Sharion Balloon, NP 07/07/19 1142

## 2019-07-09 DIAGNOSIS — Z0000 Encounter for general adult medical examination without abnormal findings: Secondary | ICD-10-CM | POA: Diagnosis not present

## 2019-07-09 DIAGNOSIS — I10 Essential (primary) hypertension: Secondary | ICD-10-CM | POA: Diagnosis not present

## 2019-07-09 DIAGNOSIS — J019 Acute sinusitis, unspecified: Secondary | ICD-10-CM | POA: Diagnosis not present

## 2019-07-09 DIAGNOSIS — J302 Other seasonal allergic rhinitis: Secondary | ICD-10-CM | POA: Diagnosis not present

## 2019-07-09 DIAGNOSIS — Z125 Encounter for screening for malignant neoplasm of prostate: Secondary | ICD-10-CM | POA: Diagnosis not present

## 2020-01-25 DIAGNOSIS — M545 Low back pain: Secondary | ICD-10-CM | POA: Diagnosis not present

## 2020-01-25 DIAGNOSIS — M6208 Separation of muscle (nontraumatic), other site: Secondary | ICD-10-CM | POA: Diagnosis not present

## 2020-01-25 DIAGNOSIS — R972 Elevated prostate specific antigen [PSA]: Secondary | ICD-10-CM | POA: Diagnosis not present

## 2020-01-25 DIAGNOSIS — R7309 Other abnormal glucose: Secondary | ICD-10-CM | POA: Diagnosis not present

## 2020-01-25 DIAGNOSIS — I10 Essential (primary) hypertension: Secondary | ICD-10-CM | POA: Diagnosis not present

## 2020-03-21 ENCOUNTER — Ambulatory Visit
Admission: EM | Admit: 2020-03-21 | Discharge: 2020-03-21 | Disposition: A | Discharge status: Home / Self Care | Payer: 59 | Attending: Emergency Medicine | Admitting: Emergency Medicine

## 2020-03-21 DIAGNOSIS — J0100 Acute maxillary sinusitis, unspecified: Secondary | ICD-10-CM

## 2020-03-21 MED ORDER — AMOXICILLIN 875 MG PO TABS: 875.0000 mg | ORAL_TABLET | Freq: Two times a day (BID) | ORAL | 0 refills | Status: AC

## 2020-03-21 NOTE — Discharge Instructions (Signed)
Take the amoxicillin as directed.  Take plain over-the-counter Mucinex as needed for congestion.    Follow up with your primary care provider or an ENT if your symptoms are not improving.

## 2020-03-21 NOTE — ED Triage Notes (Signed)
Patient reports chronic issues with sinusitis. States about two days ago, he started having facial pain and sinus congestion. Denies concerns today for covid; reports he is vaccinated. Declines testing.

## 2020-03-21 NOTE — ED Provider Notes (Signed)
Randy Santiago    CSN: [id number] Arrival date & time: 03/21/20  1203      History   Chief Complaint Chief Complaint  Patient presents with  . Sinus Problem  . Facial Pain    HPI Randy Santiago is a 59 y.o. male.   Patient presents with sinus congestion and pressure x several weeks; his symptoms became acutely worse 2 days ago.  He reports left maxillary facial pain.  He denies fever, chills, ear pain, sore throat, cough, shortness of breath, vomiting, diarrhea, or other symptoms.  Patient declines COVID test.  He is fully vaccinated.  The history is provided by the patient.    Past Medical History:  Diagnosis Date  . Asthma    H/O 20 YEARS AGO-NO INHALERS  . GERD (gastroesophageal reflux disease)   . Seasonal allergies     There are no problems to display for this patient.   Past Surgical History:  Procedure Laterality Date  . COLONOSCOPY    . HEMORRHOID BANDING    . KNEE BURSECTOMY Right 12/29/2015   Procedure: KNEE BURSECTOMY;  Surgeon: Hessie Knows, MD;  Location: [facility] ORS;  Service: Orthopedics;  Laterality: Right;  . TONSILLECTOMY         Home Medications    Prior to Admission medications   Medication Sig Start Date End Date Taking? Authorizing Provider  albuterol (VENTOLIN HFA) 108 (90 Base) MCG/ACT inhaler Inhale 2 puffs into the lungs every 4 (four) hours as needed for wheezing or shortness of breath. 07/07/19   Sharion Balloon, NP  amoxicillin (AMOXIL) 875 MG tablet Take 1 tablet (875 mg total) by mouth 2 (two) times daily for 7 days. 03/21/20 03/28/20  Sharion Balloon, NP  omeprazole (PRILOSEC) 20 MG capsule Take 1 capsule by mouth 1 day or 1 dose.    [provider]    Family History Family History  Problem Relation Age of Onset  . Cancer Mother        not sure what type  . Diabetes Sister   . Diabetes Sister   . Heart attack Sister   . Heart attack Sister     Social History Social History   Tobacco Use  . Smoking status: Never  Smoker  . Smokeless tobacco: Never Used  Substance Use Topics  . Alcohol use: No  . Drug use: No     Allergies   Patient has no known allergies.   Review of Systems Review of Systems  Constitutional: Negative for chills and fever.  HENT: Positive for congestion, sinus pressure and sinus pain. Negative for ear pain and sore throat.   Eyes: Negative for pain and visual disturbance.  Respiratory: Negative for cough and shortness of breath.   Cardiovascular: Negative for chest pain and palpitations.  Gastrointestinal: Negative for abdominal pain, diarrhea and vomiting.  Genitourinary: Negative for dysuria and hematuria.  Musculoskeletal: Negative for arthralgias and back pain.  Skin: Negative for color change and rash.  Neurological: Negative for seizures and syncope.  All other systems reviewed and are negative.    Physical Exam Triage Vital Signs ED Triage Vitals  Enc Vitals Group     BP 03/21/20 1258 (!) 150/75     Pulse Rate 03/21/20 1258 64     Resp 03/21/20 1258 16     Temp 03/21/20 1258 98.8 F (37.1 C)     Temp src --      SpO2 03/21/20 1258 96 %     Weight --  Height --      Head Circumference --      Peak Flow --      Pain Score 03/21/20 1256 0     Pain Loc --      Pain Edu? --      Excl. in [place]? --    No data found.  Updated Vital Signs BP (!) 150/75   Pulse 64   Temp 98.8 F (37.1 C)   Resp 16   SpO2 96%   Visual Acuity Right Eye Distance:   Left Eye Distance:   Bilateral Distance:    Right Eye Near:   Left Eye Near:    Bilateral Near:     Physical Exam Vitals and nursing note reviewed.  Constitutional:      General: He is not in acute distress.    Appearance: He is well-developed.  HENT:     Head: Normocephalic and atraumatic.     Right Ear: Tympanic membrane normal.     Left Ear: Tympanic membrane normal.     Nose: Congestion present.     Mouth/Throat:     Mouth: Mucous membranes are moist.     Pharynx: Oropharynx is clear.    Eyes:     Conjunctiva/sclera: Conjunctivae normal.  Cardiovascular:     Rate and Rhythm: Normal rate and regular rhythm.     Heart sounds: No murmur heard.   Pulmonary:     Effort: Pulmonary effort is normal. No respiratory distress.     Breath sounds: Normal breath sounds.  Abdominal:     Palpations: Abdomen is soft.     Tenderness: There is no abdominal tenderness. There is no guarding or rebound.  Musculoskeletal:     Cervical back: Neck supple.  Skin:    General: Skin is warm and dry.     Findings: No rash.  Neurological:     General: No focal deficit present.     Mental Status: He is alert and oriented to person, place, and time.     Gait: Gait normal.  Psychiatric:        Mood and Affect: Mood normal.        Behavior: Behavior normal.      UC Treatments / Results  Labs (all labs ordered are listed, but only abnormal results are displayed) Labs Reviewed - No data to display  EKG   Radiology No results found.  Procedures Procedures (including critical care time)  Medications Ordered in UC Medications - No data to display  Initial Impression / Assessment and Plan / UC Course  I have reviewed the triage vital signs and the nursing notes.  Pertinent labs & imaging results that were available during my care of the patient were reviewed by me and considered in my medical decision making (see chart for details).   Acute maxillary sinusitis.  Treating with amoxicillin and Mucinex.  Instructed patient to follow-up with his PCP or ENT if his symptoms or not improving.  Patient agrees to plan of care.   Final Clinical Impressions(s) / UC Diagnoses   Final diagnoses:  Acute non-recurrent maxillary sinusitis     Discharge Instructions     Take the amoxicillin as directed.  Take plain over-the-counter Mucinex as needed for congestion.    Follow up with your primary care provider or an ENT if your symptoms are not improving.       ED Prescriptions     Medication Sig Dispense Auth. Provider   amoxicillin (AMOXIL) 875 MG tablet  Take 1 tablet (875 mg total) by mouth 2 (two) times daily for 7 days. 14 tablet Sharion Balloon, NP     PDMP not reviewed this encounter.   Sharion Balloon, NP 03/21/20 [phone]

## 2020-03-24 DIAGNOSIS — J019 Acute sinusitis, unspecified: Secondary | ICD-10-CM | POA: Diagnosis not present

## 2020-03-24 DIAGNOSIS — J301 Allergic rhinitis due to pollen: Secondary | ICD-10-CM | POA: Diagnosis not present

## 2020-06-10 ENCOUNTER — Other Ambulatory Visit: Payer: Self-pay

## 2020-06-10 ENCOUNTER — Ambulatory Visit
Admission: EM | Admit: 2020-06-10 | Discharge: 2020-06-10 | Disposition: A | Discharge status: Home / Self Care | Payer: 59

## 2020-06-10 ENCOUNTER — Encounter: Payer: Self-pay | Admitting: Emergency Medicine

## 2020-06-10 DIAGNOSIS — J069 Acute upper respiratory infection, unspecified: Secondary | ICD-10-CM | POA: Diagnosis not present

## 2020-06-10 NOTE — ED Provider Notes (Signed)
Randy Santiago    CSN: [id number] Arrival date & time: 06/10/20  1312      History   Chief Complaint Chief Complaint  Patient presents with  . Cough  . Chills    HPI Randy Santiago is a 59 y.o. male.   Randy Santiago presents with complaints of congestion. Started out 4 days ago with chills, body aches, fatigue. These have since resolved. No further cough. Congestion is improving. No known ill contacts. No shortness of breath . Has been taking over the counter medications which have helped with symptoms. States he tends to have similar episodes of illness annually. He has been vaccinated for covid-19.     ROS per HPI, negative if not otherwise mentioned.      Past Medical History:  Diagnosis Date  . Asthma    H/O 20 YEARS AGO-NO INHALERS  . GERD (gastroesophageal reflux disease)   . Seasonal allergies     There are no problems to display for this patient.   Past Surgical History:  Procedure Laterality Date  . COLONOSCOPY    . HEMORRHOID BANDING    . KNEE BURSECTOMY Right 12/29/2015   Procedure: KNEE BURSECTOMY;  Surgeon: Kennedy Bucker, MD;  Location: [facility] ORS;  Service: Orthopedics;  Laterality: Right;  . TONSILLECTOMY         Home Medications    Prior to Admission medications   Medication Sig Start Date End Date Taking? Authorizing Provider  albuterol (VENTOLIN HFA) 108 (90 Base) MCG/ACT inhaler Inhale 2 puffs into the lungs every 4 (four) hours as needed for wheezing or shortness of breath. 07/07/19   Mickie Bail, NP  omeprazole (PRILOSEC) 20 MG capsule Take 1 capsule by mouth 1 day or 1 dose.    [provider]    Family History Family History  Problem Relation Age of Onset  . Cancer Mother        not sure what type  . Diabetes Sister   . Diabetes Sister   . Heart attack Sister   . Heart attack Sister     Social History Social History   Tobacco Use  . Smoking status: Never Smoker  . Smokeless tobacco: Never Used  Substance  Use Topics  . Alcohol use: No  . Drug use: No     Allergies   Patient has no known allergies.   Review of Systems Review of Systems   Physical Exam Triage Vital Signs ED Triage Vitals [06/10/20 1543]  Enc Vitals Group     BP 134/87     Pulse Rate 67     Resp 16     Temp 99.5 F (37.5 C)     Temp Source Oral     SpO2 98 %     Weight 196 lb (88.9 kg)     Height 5\' 5"  (1.651 m)     Head Circumference      Peak Flow      Pain Score 0     Pain Loc      Pain Edu?      Excl. in [place]?    No data found.  Updated Vital Signs BP 134/87 (BP Location: Left Arm)   Pulse 67   Temp 99.5 F (37.5 C) (Oral)   Resp 16   Ht 5\' 5"  (1.651 m)   Wt 196 lb (88.9 kg)   SpO2 98%   BMI 32.62 kg/m   Visual Acuity Right Eye Distance:   Left Eye Distance:  Bilateral Distance:    Right Eye Near:   Left Eye Near:    Bilateral Near:     Physical Exam Constitutional:      Appearance: He is well-developed.  HENT:     Right Ear: Tympanic membrane and ear canal normal.     Left Ear: Tympanic membrane and ear canal normal.     Nose: Congestion present.  Cardiovascular:     Rate and Rhythm: Normal rate.  Pulmonary:     Effort: Pulmonary effort is normal.     Breath sounds: Normal breath sounds.  Skin:    General: Skin is warm and dry.  Neurological:     Mental Status: He is alert and oriented to person, place, and time.      UC Treatments / Results  Labs (all labs ordered are listed, but only abnormal results are displayed) Labs Reviewed - No data to display  EKG   Radiology No results found.  Procedures Procedures (including critical care time)  Medications Ordered in UC Medications - No data to display  Initial Impression / Assessment and Plan / UC Course  I have reviewed the triage vital signs and the nursing notes.  Pertinent labs & imaging results that were available during my care of the patient were reviewed by me and considered in my medical decision  making (see chart for details).     Non toxic. Benign physical exam.  History and physical consistent with viral illness.  Supportive cares recommended. Will pursue covid testing through [facility]. Return precautions provided. Patient verbalized understanding and agreeable to plan.    Final Clinical Impressions(s) / UC Diagnoses   Final diagnoses:  Upper respiratory tract infection, unspecified type     Discharge Instructions     Push fluids to ensure adequate hydration and keep secretions thin.  Tylenol and/or ibuprofen as needed for pain or fevers.  Over the counter  medications such as dayquil/nyquil as needed for symptoms.  Unfortunately our testing for covid will not be as quick as what [facility] can provide, so I do recommend following with [facility] for this testing and return to work clarification.    ED Prescriptions    None     PDMP not reviewed this encounter.   Zigmund Gottron, NP 06/10/20 [phone]

## 2020-06-10 NOTE — ED Triage Notes (Signed)
Patient in office c/o chills, cough and fatigue with bodyache  OTC: dayquil and nyquil

## 2020-06-10 NOTE — Discharge Instructions (Signed)
Push fluids to ensure adequate hydration and keep secretions thin.  Tylenol and/or ibuprofen as needed for pain or fevers.  Over the counter  medications such as dayquil/nyquil as needed for symptoms.  Unfortunately our testing for covid will not be as quick as what [facility] can provide, so I do recommend following with [facility] for this testing and return to work clarification.

## 2020-07-11 DIAGNOSIS — Z0000 Encounter for general adult medical examination without abnormal findings: Secondary | ICD-10-CM | POA: Diagnosis not present

## 2020-07-11 DIAGNOSIS — Z1331 Encounter for screening for depression: Secondary | ICD-10-CM | POA: Diagnosis not present

## 2020-07-11 DIAGNOSIS — R7309 Other abnormal glucose: Secondary | ICD-10-CM | POA: Diagnosis not present

## 2020-07-11 DIAGNOSIS — J302 Other seasonal allergic rhinitis: Secondary | ICD-10-CM | POA: Diagnosis not present

## 2020-07-11 DIAGNOSIS — J4520 Mild intermittent asthma, uncomplicated: Secondary | ICD-10-CM | POA: Diagnosis not present

## 2020-08-29 DIAGNOSIS — G8929 Other chronic pain: Secondary | ICD-10-CM | POA: Diagnosis not present

## 2020-08-29 DIAGNOSIS — M7582 Other shoulder lesions, left shoulder: Secondary | ICD-10-CM | POA: Diagnosis not present

## 2020-08-29 DIAGNOSIS — M25512 Pain in left shoulder: Secondary | ICD-10-CM | POA: Diagnosis not present

## 2021-01-09 DIAGNOSIS — J4520 Mild intermittent asthma, uncomplicated: Secondary | ICD-10-CM | POA: Diagnosis not present

## 2021-01-09 DIAGNOSIS — Z125 Encounter for screening for malignant neoplasm of prostate: Secondary | ICD-10-CM | POA: Diagnosis not present

## 2021-01-09 DIAGNOSIS — I10 Essential (primary) hypertension: Secondary | ICD-10-CM | POA: Diagnosis not present

## 2021-01-09 DIAGNOSIS — R7309 Other abnormal glucose: Secondary | ICD-10-CM | POA: Diagnosis not present

## 2021-01-09 DIAGNOSIS — Z1211 Encounter for screening for malignant neoplasm of colon: Secondary | ICD-10-CM | POA: Diagnosis not present

## 2021-01-17 DIAGNOSIS — Z1212 Encounter for screening for malignant neoplasm of rectum: Secondary | ICD-10-CM | POA: Diagnosis not present

## 2021-01-17 DIAGNOSIS — Z1211 Encounter for screening for malignant neoplasm of colon: Secondary | ICD-10-CM | POA: Diagnosis not present

## 2021-01-22 LAB — COLOGUARD: Cologuard: POSITIVE — AB

## 2021-08-02 DIAGNOSIS — Z131 Encounter for screening for diabetes mellitus: Secondary | ICD-10-CM | POA: Diagnosis not present

## 2021-08-02 DIAGNOSIS — Z125 Encounter for screening for malignant neoplasm of prostate: Secondary | ICD-10-CM | POA: Diagnosis not present

## 2021-08-02 DIAGNOSIS — I10 Essential (primary) hypertension: Secondary | ICD-10-CM | POA: Diagnosis not present

## 2021-08-02 DIAGNOSIS — Z0000 Encounter for general adult medical examination without abnormal findings: Secondary | ICD-10-CM | POA: Diagnosis not present

## 2021-08-09 DIAGNOSIS — I10 Essential (primary) hypertension: Secondary | ICD-10-CM | POA: Diagnosis not present

## 2021-08-09 DIAGNOSIS — Z1331 Encounter for screening for depression: Secondary | ICD-10-CM | POA: Diagnosis not present

## 2021-08-09 DIAGNOSIS — E778 Other disorders of glycoprotein metabolism: Secondary | ICD-10-CM | POA: Diagnosis not present

## 2021-08-09 DIAGNOSIS — J4520 Mild intermittent asthma, uncomplicated: Secondary | ICD-10-CM | POA: Diagnosis not present

## 2021-08-09 DIAGNOSIS — Z0000 Encounter for general adult medical examination without abnormal findings: Secondary | ICD-10-CM | POA: Diagnosis not present

## 2021-08-09 DIAGNOSIS — Z23 Encounter for immunization: Secondary | ICD-10-CM | POA: Diagnosis not present

## 2021-10-27 DIAGNOSIS — R195 Other fecal abnormalities: Secondary | ICD-10-CM | POA: Diagnosis not present

## 2021-11-01 ENCOUNTER — Encounter: Payer: Self-pay | Admitting: Physician Assistant

## 2021-11-20 DIAGNOSIS — N529 Male erectile dysfunction, unspecified: Secondary | ICD-10-CM | POA: Diagnosis not present

## 2021-11-21 ENCOUNTER — Encounter: Payer: Self-pay | Admitting: Physician Assistant

## 2021-11-22 NOTE — Progress Notes (Signed)
11/23/2021 Randy Santiago [id number] 22-Nov-1960  Referring provider: [facility] doctor: Dr. Lorenso Courier  ASSESSMENT AND PLAN:   Positive colorectal cancer screening using Cologuard test We have discussed the risks of bleeding, infection, perforation, medication reactions, and remote risk of death associated with colonoscopy. All questions were answered and the patient acknowledges these risk and wishes to proceed.  Chronic idiopathic constipation - Increase fiber/ water intake, decrease caffeine, increase activity level. -Will add on Benefiber - possible component of pelvic floor with diastasis/history versus slow transit- given information   Hemorrhoids, unspecified hemorrhoid type -Sitz baths, increase fiber, increase water, need to fix toilet habits.  - can discuss treatment after colonoscopy.  - follow up for evaluation   Patient Care Team: [name] as PCP - General  HISTORY OF PRESENT ILLNESS: 61 y.o. male with a past medical history of hypertension, asthma, GERD, history of hemorrhoidal banding in the past. and others listed below presents for evaluation of gas, constipation, fecal urgency and prolapsed hemorrhoids.  Patient had positive Cologuard 02/06/2021. Patient's [employer] employee would like to be scoped at [facility] due to insurance reasons.  Was at first seen by Dr. Alice Reichert with [facility].  States did have colonoscopy 2009 at [facility], can not see report.   07/2021 Labs reviewed hemoglobin 14.8, creatinine 0.9, A1c 5.7, thyroid 1.5  Patient denies GERD. Takes PPI as needed.  Patient denies dysphagia, nausea, vomiting, melena.  Patient has a BM 2-3 x a day, sometimes straining and harder stool about 3 x out of the week.  States no blood in the stool but he has felt hemorrhoidal prolapse and will have to push it in.   Has had 3 hemorrhoidal banding in the past. Was on miralax in the past and uncertain if it helped Patient denies  diarrhea, hematochezia.  Denies changes in appetite, unintentional weight loss.  Patient denies family history of colon cancer or other gastrointestinal malignancies.   Current Medications:    Current Outpatient Medications (Cardiovascular):    amLODipine (NORVASC) 2.5 MG tablet, Take 1 tablet by mouth daily.   sildenafil (REVATIO) 20 MG tablet, Take 1 to 5 tablets by mouth as needed for erectile dysfunction. Take no more than once a day. Take the lowest does necessary.   tadalafil (CIALIS) 5 MG tablet, Take by mouth.      Medical History:  Past Medical History:  Diagnosis Date   Asthma    H/O 20 YEARS AGO-NO INHALERS   GERD (gastroesophageal reflux disease)    Hemorrhoids    Hypertension    Seasonal allergies    Allergies: No Known Allergies   Surgical History:  He  has a past surgical history that includes Tonsillectomy; Hemorrhoid banding; Colonoscopy; and Knee bursectomy (Right, 12/29/2015). Family History:  His family history includes Cancer in his mother; Diabetes in his sister and sister; Heart attack in his sister and sister. Social History:   reports that he has never smoked. He has never used smokeless tobacco. He reports that he does not drink alcohol and does not use drugs.  REVIEW OF SYSTEMS  : All other systems reviewed and negative except where noted in the History of Present Illness.   PHYSICAL EXAM: BP 120/70   Pulse 61   Ht '5\' 7"'$  (1.702 m)   Wt 188 lb 4 oz (85.4 kg)   SpO2 97%   BMI 29.48 kg/m  General:   Pleasant, well developed male in no acute distress Head:  Normocephalic and atraumatic. Eyes:  sclerae anicteric,conjunctive pink  Heart:   regular rate and rhythm Pulm:  Clear anteriorly; no wheezing Abdomen:   Soft, Obese AB, 2 finger diastasis Active bowel sounds. No tenderness . Without guarding and Without rebound, No organomegaly appreciated. Rectal: declines Extremities:  Without edema. Msk: Symmetrical without gross deformities.  Peripheral pulses intact.  Neurologic:  Alert and  oriented x4;  No focal deficits.  Skin:   Dry and intact without significant lesions or rashes. Psychiatric:  Cooperative. Normal mood and affect.    Randy Crofts, PA-C 9:34 AM

## 2021-11-23 ENCOUNTER — Ambulatory Visit (INDEPENDENT_AMBULATORY_CARE_PROVIDER_SITE_OTHER): Payer: 59 | Admitting: Physician Assistant

## 2021-11-23 ENCOUNTER — Encounter: Payer: Self-pay | Admitting: Physician Assistant

## 2021-11-23 VITALS — BP 120/70 | HR 61 | Ht 67.0 in | Wt 188.2 lb

## 2021-11-23 DIAGNOSIS — R195 Other fecal abnormalities: Secondary | ICD-10-CM

## 2021-11-23 DIAGNOSIS — K649 Unspecified hemorrhoids: Secondary | ICD-10-CM

## 2021-11-23 DIAGNOSIS — K5904 Chronic idiopathic constipation: Secondary | ICD-10-CM

## 2021-11-23 DIAGNOSIS — N529 Male erectile dysfunction, unspecified: Secondary | ICD-10-CM | POA: Diagnosis not present

## 2021-11-23 NOTE — Progress Notes (Signed)
I agree with the assessment and plan as outlined by Ms. Collier. 

## 2021-11-23 NOTE — Patient Instructions (Signed)
Recommend starting on a fiber supplement, can try metamucil first but if this causes gas/bloating switch to benefiber Take with fiber with with a full 8 oz glass of water once a day. This can take 1 month to start helping, so try for at least one month.  Recommend increasing water and activity.  Get a squatty potty to use at home or try a stool, goal is to get your knees above your hips during a bowel movement.  - Drink at least 64-80 ounces of water/liquid per day. - Establish a time to try to move your bowels every day.  For many people, this is after a cup of coffee or after a meal such as breakfast. - Sit all of the way back on the toilet keeping your back fairly straight and while sitting up, try to rest the tops of your forearms on your upper thighs.   - Raising your feet with a step stool/squatty potty can be helpful to improve the angle that allows your stool to pass through the rectum. - Relax the rectum feeling it bulge toward the toilet water.  If you feel your rectum raising toward your body, you are contracting rather than relaxing. - Breathe in and slowly exhale. "Belly breath" by expanding your belly towards your belly button. Keep belly expanded as you gently direct pressure down and back to the anus.  A low pitched GRRR sound can assist with increasing intra-abdominal pressure.  - Repeat 3-4 times. If unsuccessful, contract the pelvic floor to restore normal tone and get off the toilet.  Avoid excessive straining. - To reduce excessive wiping by teaching your anus to normally contract, place hands on outer aspect of knees and resist knee movement outward.  Hold 5-10 second then place hands just inside of knees and resist inward movement of knees.  Hold 5 seconds.  Repeat a few times each way.  Go to the ER if unable to pass gas, severe AB pain, unable to hold down food, any shortness of breath of chest pain.   Abdominal bloating and discomfort may be due to intestinal sensitivity or  symptoms of irritable bowel syndrome. To relieve symptoms, avoid:  Broccoli  Baked beans  Cabbage  Carbonated drinks  Cauliflower  Chewing gum  Hard candy Abdominal distention resulting from weak abdominal muscles:  Is better in the morning  Gets worse as the day progresses  Is relieved by lying down Flatulence is gas created through bacterial action in the bowel and passed rectally. Keep in mind that:  10-18 passages per day are normal  Primary gases are harmless and odorless  Noticeable smells are trace gases related to food intake Foods to AVOID that are likely to form gas include:  Milk, dairy products, and medications that contain lactose--If your body doesn't produce the enzyme (lactase) to break it down.  Certain vegetables--baked beans, cauliflower, broccoli, cabbage  Certain starches--wheat, oats, corn, potatoes. Rice is a good substitute. Identify offending foods. Reduce or eliminate these gas-forming foods from your diet. Can look at the [place].     FODMAP stands for fermentable oligo-, di-, mono-saccharides and polyols (1). These are the scientific terms used to classify groups of carbs that are notorious for triggering digestive symptoms like bloating, gas and stomach pain.   FODMAPs are found in a wide range of foods in varying amounts. Some foods contain just one type, while others contain several.  The main dietary sources of the four groups of FODMAPs include:  Oligosaccharides: Wheat,  rye, legumes and various fruits and vegetables, such as garlic and onions.  Disaccharides: Milk, yogurt and soft cheese. Lactose is the main carb.  Monosaccharides: Various fruit including figs and mangoes, and sweeteners such as honey and agave nectar. Fructose is the main carb.  Polyols: Certain fruits and vegetables including blackberries and lychee, as well as some low-calorie sweeteners like those in sugar-free gum.   Keep a food diary. This will help you identify foods  that cause symptoms. Write down: What you eat and when. What symptoms you have. When symptoms occur in relation to your meals. Avoid foods that cause symptoms. Talk with your dietitian about other ways to get the same nutrients that are in these foods. Eat your meals slowly, in a relaxed setting. Aim to eat 5-6 small meals per day. Do not skip meals. Drink enough fluids to keep your urine clear or pale yellow. If dairy products cause your symptoms to flare up, try eating less of them. You might be able to handle yogurt better than other dairy products because it contains bacteria that help with digestion.

## 2021-12-28 ENCOUNTER — Encounter: Payer: Self-pay | Admitting: Internal Medicine

## 2021-12-31 ENCOUNTER — Encounter: Payer: Self-pay | Admitting: Certified Registered Nurse Anesthetist

## 2022-01-04 ENCOUNTER — Encounter: Payer: Self-pay | Admitting: Internal Medicine

## 2022-01-04 ENCOUNTER — Ambulatory Visit (AMBULATORY_SURGERY_CENTER): Payer: 59 | Admitting: Internal Medicine

## 2022-01-04 VITALS — BP 108/66 | HR 66 | Temp 96.0°F | Resp 19 | Ht 67.0 in | Wt 188.0 lb

## 2022-01-04 DIAGNOSIS — K5904 Chronic idiopathic constipation: Secondary | ICD-10-CM | POA: Diagnosis not present

## 2022-01-04 DIAGNOSIS — R195 Other fecal abnormalities: Secondary | ICD-10-CM | POA: Diagnosis not present

## 2022-01-04 DIAGNOSIS — D125 Benign neoplasm of sigmoid colon: Secondary | ICD-10-CM

## 2022-01-04 DIAGNOSIS — K648 Other hemorrhoids: Secondary | ICD-10-CM | POA: Diagnosis not present

## 2022-01-04 DIAGNOSIS — Z1211 Encounter for screening for malignant neoplasm of colon: Secondary | ICD-10-CM | POA: Diagnosis not present

## 2022-01-04 DIAGNOSIS — D123 Benign neoplasm of transverse colon: Secondary | ICD-10-CM | POA: Diagnosis not present

## 2022-01-04 DIAGNOSIS — I10 Essential (primary) hypertension: Secondary | ICD-10-CM | POA: Diagnosis not present

## 2022-01-04 DIAGNOSIS — J45909 Unspecified asthma, uncomplicated: Secondary | ICD-10-CM | POA: Diagnosis not present

## 2022-01-04 MED ORDER — HYDROCORTISONE (PERIANAL) 2.5 % EX CREA: 1.0000 | TOPICAL_CREAM | Freq: Two times a day (BID) | CUTANEOUS | 1 refills | Status: DC

## 2022-01-04 MED ORDER — SODIUM CHLORIDE 0.9 % IV SOLN: 500.0000 mL | Freq: Once | INTRAVENOUS | Status: DC

## 2022-01-04 NOTE — Op Note (Signed)
[facility] Patient Name: Randy Santiago Procedure Date: 01/04/2022 11:33 AM MRN: [id number] Endoscopist: Sonny Masters "Randy Santiago ,  Age: 61 Referring MD:  Date of Birth: 1960-06-27 Gender: Male Account #: [id number] Procedure:                Colonoscopy Indications:              Positive fecal immunochemical test Medicines:                Monitored Anesthesia Care Procedure:                Pre-Anesthesia Assessment:                           - Prior to the procedure, a History and Physical                            was performed, and patient medications and                            allergies were reviewed. The patient's tolerance of                            previous anesthesia was also reviewed. The risks                            and benefits of the procedure and the sedation                            options and risks were discussed with the patient.                            All questions were answered, and informed consent                            was obtained. Prior Anticoagulants: The patient has                            taken no previous anticoagulant or antiplatelet                            agents. ASA Grade Assessment: II - A patient with                            mild systemic disease. After reviewing the risks                            and benefits, the patient was deemed in                            satisfactory condition to undergo the procedure.                           After obtaining informed consent, the colonoscope  was passed under direct vision. Throughout the                            procedure, the patient's blood pressure, pulse, and                            oxygen saturations were monitored continuously. The                            CF [id number] #[id number] was introduced through the anus                            and advanced to the the terminal ileum. The                            colonoscopy was performed  without difficulty. The                            patient tolerated the procedure well. The quality                            of the bowel preparation was excellent. The                            terminal ileum, ileocecal valve, appendiceal                            orifice, and rectum were photographed. Scope In: 11:42:47 AM Scope Out: 11:58:33 AM Scope Withdrawal Time: 0 hours 14 minutes 3 seconds  Total Procedure Duration: 0 hours 15 minutes 46 seconds  Findings:                 The terminal ileum appeared normal.                           Three sessile polyps were found in the transverse                            colon. The polyps were 3 to 4 mm in size. These                            polyps were removed with a cold snare. Resection                            and retrieval were complete.                           A 5 mm polyp was found in the sigmoid colon. The                            polyp was sessile. The polyp was removed with a                            cold snare.  Resection and retrieval were complete.                           Non-bleeding internal hemorrhoids were found during                            retroflexion. Complications:            No immediate complications. Estimated Blood Loss:     Estimated blood loss was minimal. Impression:               - The examined portion of the ileum was normal.                           - Three 3 to 4 mm polyps in the transverse colon,                            removed with a cold snare. Resected and retrieved.                           - One 5 mm polyp in the sigmoid colon, removed with                            a cold snare. Resected and retrieved.                           - Non-bleeding internal hemorrhoids. Recommendation:           - Discharge patient to home (with escort).                           - Await pathology results.                           - Anusol HC cream BID for 7 days.                           -  Return to GI clinic in 6 weeks if you are                            interested in hemorrhoidal banding or discussing                            your constipation issues further.                           - The findings and recommendations were discussed                            with the patient. [address]Randy Santiago,  01/04/2022 12:04:45 PM

## 2022-01-04 NOTE — Progress Notes (Signed)
GASTROENTEROLOGY PROCEDURE H&P NOTE   Primary Care Physician: Irena    Reason for Procedure:   Positive Cologuard  Plan:    Colonoscopy  Patient is appropriate for endoscopic procedure(s) in the ambulatory ([facility]) setting.  The nature of the procedure, as well as the risks, benefits, and alternatives were carefully and thoroughly reviewed with the patient. Ample time for discussion and questions allowed. The patient understood, was satisfied, and agreed to proceed.     HPI: Randy Santiago is a 61 y.o. male who presents for colonoscopy for evaluation of positive Cologuard .  Patient was most recently seen in the Gastroenterology Clinic on 11/23/21.  No interval change in medical history since that appointment. Please refer to that note for full details regarding GI history and clinical presentation.   Past Medical History:  Diagnosis Date   Asthma    H/O 20 YEARS AGO-NO INHALERS   GERD (gastroesophageal reflux disease)    Hemorrhoids    Hypertension    Seasonal allergies     Past Surgical History:  Procedure Laterality Date   COLONOSCOPY     HEMORRHOID BANDING     KNEE BURSECTOMY Right 12/29/2015   Procedure: KNEE BURSECTOMY;  Surgeon: Hessie Knows, MD;  Location: [facility] ORS;  Service: Orthopedics;  Laterality: Right;   TONSILLECTOMY      Prior to Admission medications   Medication Sig Start Date End Date Taking? Authorizing Provider  amLODipine (NORVASC) 2.5 MG tablet Take 1 tablet by mouth daily. 11/14/21  Yes [provider]  sildenafil (REVATIO) 20 MG tablet Take 1 to 5 tablets by mouth as needed for erectile dysfunction. Take no more than once a day. Take the lowest does necessary. 11/20/21   [provider]  tadalafil (CIALIS) 5 MG tablet Take by mouth. 11/20/21 11/20/22  [provider]    Current Outpatient Medications  Medication Sig Dispense Refill   amLODipine (NORVASC) 2.5 MG tablet Take 1 tablet by mouth daily.     sildenafil  (REVATIO) 20 MG tablet Take 1 to 5 tablets by mouth as needed for erectile dysfunction. Take no more than once a day. Take the lowest does necessary.     tadalafil (CIALIS) 5 MG tablet Take by mouth.     Current Facility-Administered Medications  Medication Dose Route Frequency Provider Last Rate Last Admin   0.9 %  sodium chloride infusion  500 mL Intravenous Once Sharyn Creamer, MD        Allergies as of 01/04/2022   (No Known Allergies)    Family History  Problem Relation Age of Onset   Cancer Mother        not sure what type   Diabetes Sister    Diabetes Sister    Heart attack Sister    Heart attack Sister    Colon cancer Neg Hx    Stomach cancer Neg Hx    Esophageal cancer Neg Hx    Rectal cancer Neg Hx     Social History   Socioeconomic History   Marital status: Divorced    Spouse name: Not on file   Number of children: Not on file   Years of education: Not on file   Highest education level: Not on file  Occupational History   Not on file  Tobacco Use   Smoking status: Never   Smokeless tobacco: Never  Vaping Use   Vaping Use: Never used  Substance and Sexual Activity   Alcohol use: Yes  Comment: occasionally   Drug use: No   Sexual activity: Not on file  Other Topics Concern   Not on file  Social History Narrative   Not on file   Social Determinants of Health   Financial Resource Strain: Not on file  Food Insecurity: Not on file  Transportation Needs: Not on file  Physical Activity: Not on file  Stress: Not on file  Social Connections: Not on file  Intimate Partner Violence: Not on file    Physical Exam: Vital signs in last 24 hours: BP 135/77   Pulse (!) 58   Temp (!) 96 F (35.6 C)   Ht '5\' 7"'$  (1.702 m)   Wt 188 lb (85.3 kg)   SpO2 100%   BMI 29.44 kg/m  GEN: NAD EYE: Sclerae anicteric ENT: MMM CV: Non-tachycardic Pulm: No increased WOB GI: Soft NEURO:  Alert & Oriented   [name], MD [facility]   01/04/2022 11:30 AM

## 2022-01-04 NOTE — Progress Notes (Signed)
Report given to PACU, vss 

## 2022-01-04 NOTE — Patient Instructions (Addendum)
YOU HAD AN ENDOSCOPIC PROCEDURE TODAY AT [facility]:   Refer to the procedure report that was given to you for any specific questions about what was found during the examination.  If the procedure report does not answer your questions, please call your gastroenterologist to clarify.  If you requested that your care partner not be given the details of your procedure findings, then the procedure report has been included in a sealed envelope for you to review at your convenience later.  YOU SHOULD EXPECT: Some feelings of bloating in the abdomen. Passage of more gas than usual.  Walking can help get rid of the air that was put into your GI tract during the procedure and reduce the bloating. If you had a lower endoscopy (such as a colonoscopy or flexible sigmoidoscopy) you may notice spotting of blood in your stool or on the toilet paper. If you underwent a bowel prep for your procedure, you may not have a normal bowel movement for a few days.  Please Note:  You might notice some irritation and congestion in your nose or some drainage.  This is from the oxygen used during your procedure.  There is no need for concern and it should clear up in a day or so.  SYMPTOMS TO REPORT IMMEDIATELY:  Following lower endoscopy (colonoscopy or flexible sigmoidoscopy):  Excessive amounts of blood in the stool  Significant tenderness or worsening of abdominal pains  Swelling of the abdomen that is new, acute  Fever of 100F or higher     For urgent or emergent issues, a gastroenterologist can be reached at any hour by calling [phone]. Do not use MyChart messaging for urgent concerns.    DIET:  We do recommend a small meal at first, but then you may proceed to your regular diet.  Drink plenty of fluids but you should avoid alcoholic beverages for 24 hours.  MEDICATIONS: Continue present medications. Apply Anusol HC cream to rectum twice daily for 7 days.  Please see handouts given to you  by your recovery nurse.  FOLLOW UP: Return to GI clinic in 6 weeks if you are interested in hemorrhoidal banding or in discussing your constipation issues further.  Thank you for allowing [place] to provide for your healthcare needs today.  ACTIVITY:  You should plan to take it easy for the rest of today and you should NOT DRIVE or use heavy machinery until tomorrow (because of the sedation medicines used during the test).    FOLLOW UP: Our staff will call the number listed on your records the next business day following your procedure.  We will call around 7:15- 8:00 am to check on you and address any questions or concerns that you may have regarding the information given to you following your procedure. If we do not reach you, we will leave a message.  If you develop any symptoms (ie: fever, flu-like symptoms, shortness of breath, cough etc.) before then, please call [phone].  If you test positive for Covid 19 in the 2 weeks post procedure, please call and report this information to [place].    If any biopsies were taken you will be contacted by phone or by letter within the next 1-3 weeks.  Please call us at [phone] if you have not heard about the biopsies in 3 weeks.    SIGNATURES/CONFIDENTIALITY: You and/or your care partner have signed paperwork which will be entered into your electronic medical record.  These signatures attest to the  fact that that the information above on your After Visit Summary has been reviewed and is understood.  Full responsibility of the confidentiality of this discharge information lies with you and/or your care-partner.

## 2022-01-04 NOTE — Progress Notes (Signed)
Called to room to assist during endoscopic procedure.  Patient ID and intended procedure confirmed with present staff. Received instructions for my participation in the procedure from the performing physician.  

## 2022-01-05 ENCOUNTER — Telehealth: Payer: Self-pay

## 2022-01-05 NOTE — Telephone Encounter (Signed)
  Follow up Call-     01/04/2022   11:01 AM  Call back number  Post procedure Call Back phone  # [phone]  Permission to leave phone message Yes     Patient questions:  Do you have a fever, pain , or abdominal swelling? Yes.   Pain Score  0 *  Have you tolerated food without any problems? Yes.    Have you been able to return to your normal activities? Yes.    Do you have any questions about your discharge instructions: Diet   No. Medications  No. Follow up visit  No.  Do you have questions or concerns about your Care? No.  Actions: * If pain score is 4 or above: No action needed, pain <4.

## 2022-01-08 ENCOUNTER — Encounter: Payer: Self-pay | Admitting: Internal Medicine

## 2022-01-12 ENCOUNTER — Telehealth: Payer: Self-pay | Admitting: Internal Medicine

## 2022-01-12 NOTE — Telephone Encounter (Signed)
The pt had questions regarding his pathology letter received.  We discussed the contents and I explained that the polyps were precancerous and NOT cancerous and they have been removed.  He agrees to 3 year follow up.  All questions answered to the best of my ability.

## 2022-01-12 NOTE — Telephone Encounter (Signed)
Inbound call from patient stating he would like for a provider to give him a call at their earliest convenience to discuss lab results. Patient states he needs clarification. Please give a call to further advise.

## 2022-01-24 NOTE — Telephone Encounter (Signed)
Copied and pasted the scope information to the patient. Sent via My Chart as requested.

## 2022-01-24 NOTE — Telephone Encounter (Signed)
Inbound call from patient requesting to be messaged via [facility] to verify the type of scope that was used on him during his procedure 01/04/22. Patient inquiring if the scope was flex or ridged. Please further advise.  Thank you

## 2022-01-30 DIAGNOSIS — E778 Other disorders of glycoprotein metabolism: Secondary | ICD-10-CM | POA: Diagnosis not present

## 2022-01-30 DIAGNOSIS — I10 Essential (primary) hypertension: Secondary | ICD-10-CM | POA: Diagnosis not present

## 2022-01-30 DIAGNOSIS — Z6832 Body mass index (BMI) 32.0-32.9, adult: Secondary | ICD-10-CM | POA: Diagnosis not present

## 2022-01-30 DIAGNOSIS — Z0000 Encounter for general adult medical examination without abnormal findings: Secondary | ICD-10-CM | POA: Diagnosis not present

## 2022-01-30 DIAGNOSIS — J4520 Mild intermittent asthma, uncomplicated: Secondary | ICD-10-CM | POA: Diagnosis not present

## 2022-02-06 DIAGNOSIS — Z6832 Body mass index (BMI) 32.0-32.9, adult: Secondary | ICD-10-CM | POA: Diagnosis not present

## 2022-02-06 DIAGNOSIS — J4520 Mild intermittent asthma, uncomplicated: Secondary | ICD-10-CM | POA: Diagnosis not present

## 2022-02-06 DIAGNOSIS — R7309 Other abnormal glucose: Secondary | ICD-10-CM | POA: Diagnosis not present

## 2022-02-07 DIAGNOSIS — N529 Male erectile dysfunction, unspecified: Secondary | ICD-10-CM | POA: Diagnosis not present

## 2022-02-07 DIAGNOSIS — Z125 Encounter for screening for malignant neoplasm of prostate: Secondary | ICD-10-CM | POA: Diagnosis not present

## 2022-02-07 DIAGNOSIS — C61 Malignant neoplasm of prostate: Secondary | ICD-10-CM | POA: Diagnosis not present

## 2022-08-02 DIAGNOSIS — Z125 Encounter for screening for malignant neoplasm of prostate: Secondary | ICD-10-CM | POA: Diagnosis not present

## 2022-08-02 DIAGNOSIS — Z6832 Body mass index (BMI) 32.0-32.9, adult: Secondary | ICD-10-CM | POA: Diagnosis not present

## 2022-08-02 DIAGNOSIS — J4520 Mild intermittent asthma, uncomplicated: Secondary | ICD-10-CM | POA: Diagnosis not present

## 2022-08-02 DIAGNOSIS — R7309 Other abnormal glucose: Secondary | ICD-10-CM | POA: Diagnosis not present

## 2022-08-09 DIAGNOSIS — M6208 Separation of muscle (nontraumatic), other site: Secondary | ICD-10-CM | POA: Diagnosis not present

## 2022-08-09 DIAGNOSIS — M7061 Trochanteric bursitis, right hip: Secondary | ICD-10-CM | POA: Diagnosis not present

## 2022-08-09 DIAGNOSIS — R7309 Other abnormal glucose: Secondary | ICD-10-CM | POA: Diagnosis not present

## 2022-08-09 DIAGNOSIS — I10 Essential (primary) hypertension: Secondary | ICD-10-CM | POA: Diagnosis not present

## 2022-08-09 DIAGNOSIS — Z0000 Encounter for general adult medical examination without abnormal findings: Secondary | ICD-10-CM | POA: Diagnosis not present

## 2022-08-10 DIAGNOSIS — R7309 Other abnormal glucose: Secondary | ICD-10-CM | POA: Diagnosis not present

## 2022-08-10 DIAGNOSIS — I10 Essential (primary) hypertension: Secondary | ICD-10-CM | POA: Diagnosis not present

## 2022-08-10 DIAGNOSIS — Z0000 Encounter for general adult medical examination without abnormal findings: Secondary | ICD-10-CM | POA: Diagnosis not present

## 2022-08-10 DIAGNOSIS — M7061 Trochanteric bursitis, right hip: Secondary | ICD-10-CM | POA: Diagnosis not present

## 2022-08-10 DIAGNOSIS — M6208 Separation of muscle (nontraumatic), other site: Secondary | ICD-10-CM | POA: Diagnosis not present

## 2022-10-24 DIAGNOSIS — I499 Cardiac arrhythmia, unspecified: Secondary | ICD-10-CM | POA: Diagnosis not present

## 2022-11-02 DIAGNOSIS — I499 Cardiac arrhythmia, unspecified: Secondary | ICD-10-CM | POA: Diagnosis not present

## 2022-11-09 NOTE — Progress Notes (Deleted)
11/09/2022 Randy Santiago [id number] 1961/05/21  Referring provider: [facility] Primary GI doctor: Dr. Leonides Schanz  ASSESSMENT AND PLAN:      Patient Care Team: [facility] as PCP - General  HISTORY OF PRESENT ILLNESS: 62 y.o. male with a past medical history of hypertension, asthma, GERD, history of hemorrhoidal banding in the past. and others listed below presents for evaluation of gas, constipation, fecal urgency and prolapsed hemorrhoids.   Patient had positive Cologuard 02/06/2021. 11/23/2021 office visit with myself for positive Cologuard 02/06/2021. 01/04/2022 colonoscopy with Dr. Leonides Schanz showed excellent prep. 3 polyps 3 to 4 mm transverse colon.  1 5 mm polyp sigmoid colon.  Nonbleeding internal hemorrhoids.  Prescribed Anusol HC cream for 7 days and here to discuss hemorrhoidal banding.  Precancerous polyps, recall 3 years.  Patient denies GERD. Takes PPI as needed.  Patient denies dysphagia, nausea, vomiting, melena.  Patient has a BM 2-3 x a day, sometimes straining and harder stool about 3 x out of the week.  States no blood in the stool but he has felt hemorrhoidal prolapse and will have to push it in.   Has had 3 hemorrhoidal banding in the past. Was on miralax in the past and uncertain if it helped Patient denies diarrhea, hematochezia.  Denies changes in appetite, unintentional weight loss.  Patient denies family history of colon cancer or other gastrointestinal malignancies.   Current Medications:    Current Outpatient Medications (Cardiovascular):    amLODipine (NORVASC) 2.5 MG tablet, Take 1 tablet by mouth daily.   sildenafil (REVATIO) 20 MG tablet, Take 1 to 5 tablets by mouth as needed for erectile dysfunction. Take no more than once a day. Take the lowest does necessary.   tadalafil (CIALIS) 5 MG tablet, Take by mouth.     Current Outpatient Medications (Other):    hydrocortisone (ANUSOL-HC) 2.5 % rectal cream, Place 1 Application  rectally 2 (two) times daily. Apply to rectum twice daily for 7 days.   Medical History:  Past Medical History:  Diagnosis Date   Asthma    H/O 20 YEARS AGO-NO INHALERS   GERD (gastroesophageal reflux disease)    Hemorrhoids    Hypertension    Seasonal allergies    Allergies: No Known Allergies   Surgical History:  He  has a past surgical history that includes Tonsillectomy; Hemorrhoid banding; Colonoscopy; and Knee bursectomy (Right, 12/29/2015). Family History:  His family history includes Cancer in his mother; Diabetes in his sister and sister; Heart attack in his sister and sister. Social History:   reports that he has never smoked. He has never used smokeless tobacco. He reports current alcohol use. He reports that he does not use drugs.  REVIEW OF SYSTEMS  : All other systems reviewed and negative except where noted in the History of Present Illness.   PHYSICAL EXAM: There were no vitals taken for this visit. General:   Pleasant, well developed male in no acute distress Head:   Normocephalic and atraumatic. Eyes:  sclerae anicteric,conjunctive pink  Heart:   regular rate and rhythm Pulm:  Clear anteriorly; no wheezing Abdomen:   Soft, Obese AB, 2 finger diastasis Active bowel sounds. No tenderness . Without guarding and Without rebound, No organomegaly appreciated. Rectal: declines Extremities:  Without edema. Msk: Symmetrical without gross deformities. Peripheral pulses intact.  Neurologic:  Alert and  oriented x4;  No focal deficits.  Skin:   Dry and intact without significant lesions or rashes. Psychiatric:  Cooperative. Normal mood  and affect.    [name], PA-C 8:27 AM

## 2022-11-13 ENCOUNTER — Ambulatory Visit: Payer: Commercial Managed Care - PPO | Admitting: Physician Assistant

## 2022-11-19 NOTE — Progress Notes (Unsigned)
11/20/2022 Randy Santiago [id number] 04/30/1961  Referring provider: [facility] Primary GI doctor: Dr. Leonides Schanz  ASSESSMENT AND PLAN:   Chronic idiopathic constipation - Increase fiber/ water intake, decrease caffeine, increase activity level. -Will add on Miralax daily and Benefiber - given fodmap diet - consider SIBO testing in the future  Hemorrhoids, unspecified hemorrhoid type Has had in the past with the [facility] system, would like eventually to get the banding with the [facility] system but would like to work on his bowel habits first   History of adenomatous polyp of colon Recall 12/2024   Patient Care Team: [facility] as PCP - General  HISTORY OF PRESENT ILLNESS: 62 y.o. male with a past medical history of hypertension, asthma, GERD, history of hemorrhoidal banding in the past. and others listed below presents for evaluation of gas, constipation, fecal urgency and prolapsed hemorrhoids.   Patient had positive Cologuard 02/06/2021. 11/23/2021 office visit with myself for positive Cologuard 02/06/2021. 01/04/2022 colonoscopy with Dr. Leonides Schanz showed excellent prep. 3 polyps 3 to 4 mm transverse colon.   5 mm polyp sigmoid colon.  Nonbleeding internal hemorrhoids.  Prescribed Anusol HC cream for 7 days and here to discuss hemorrhoidal banding.  Precancerous polyps, recall 3 years. 12/2024  Can have hard small stools, occ will have well formed stools.  Can have BM 1-2 x a day, feels complete BM.  He has a lot of gas, can have bloating between Bm's.  No Ab pain, states he has a lot of bubbling and noise, not dependent with what he does.  He has small prolapse with BM that are hard, can be irritating, can push it in and will stay. No bleeding associated with them at this time.  Patient denies GERD. Takes PPI as needed.  Patient denies dysphagia, nausea, vomiting, melena.  Patient denies family history of colon cancer or other gastrointestinal  malignancies.   Current Medications:    Current Outpatient Medications (Cardiovascular):    sildenafil (REVATIO) 20 MG tablet, Take 1 to 5 tablets by mouth as needed for erectile dysfunction. Take no more than once a day. Take the lowest does necessary.   tadalafil (CIALIS) 5 MG tablet, Take by mouth daily as needed.       Medical History:  Past Medical History:  Diagnosis Date   Asthma    H/O 20 YEARS AGO-NO INHALERS   GERD (gastroesophageal reflux disease)    Hemorrhoids    Hypertension    Seasonal allergies    Allergies: No Known Allergies   Surgical History:  He  has a past surgical history that includes Tonsillectomy; Hemorrhoid banding; Colonoscopy; and Knee bursectomy (Right, 12/29/2015). Family History:  His family history includes Aneurysm in his sister; Cancer in his mother; Diabetes in his sister; Heart attack in his sister and sister; Other in his father. Social History:   reports that he has never smoked. He has never used smokeless tobacco. He reports current alcohol use. He reports that he does not use drugs.  REVIEW OF SYSTEMS  : All other systems reviewed and negative except where noted in the History of Present Illness.   PHYSICAL EXAM: BP 138/80   Pulse (!) 57   Ht 5\' 6"  (1.676 m)   Wt 191 lb (86.6 kg)   BMI 30.83 kg/m  General:   Pleasant, well developed male in no acute distress Head:   Normocephalic and atraumatic. Eyes:  sclerae anicteric,conjunctive pink  Heart:   regular rate and rhythm Pulm:  Clear anteriorly; no wheezing Abdomen:   Soft, Obese AB, 2 finger diastasis Active bowel sounds. No tenderness . Without guarding and Without rebound, No organomegaly appreciated. Rectal: declines Extremities:  Without edema. Msk: Symmetrical without gross deformities. Peripheral pulses intact.  Neurologic:  Alert and  oriented x4;  No focal deficits.  Skin:   Dry and intact without significant lesions or rashes. Psychiatric:  Cooperative. Normal  mood and affect.    Randy Albee, PA-C 8:47 AM

## 2022-11-20 ENCOUNTER — Encounter: Payer: Self-pay | Admitting: Physician Assistant

## 2022-11-20 ENCOUNTER — Ambulatory Visit (INDEPENDENT_AMBULATORY_CARE_PROVIDER_SITE_OTHER): Payer: 59 | Admitting: Physician Assistant

## 2022-11-20 VITALS — BP 138/80 | HR 57 | Ht 66.0 in | Wt 191.0 lb

## 2022-11-20 DIAGNOSIS — K649 Unspecified hemorrhoids: Secondary | ICD-10-CM

## 2022-11-20 DIAGNOSIS — Z86010 Personal history of colonic polyps: Secondary | ICD-10-CM | POA: Diagnosis not present

## 2022-11-20 DIAGNOSIS — K5904 Chronic idiopathic constipation: Secondary | ICD-10-CM | POA: Diagnosis not present

## 2022-11-20 NOTE — Patient Instructions (Addendum)
Miralax is an osmotic laxative.  It only brings more water into the stool.  This is safe to take daily.  Can take up to 17 gram of miralax twice a day.  Mix with juice or coffee.  Start 1/2-1 capful at night or morning for 3-4 days and reassess your response in 3-4 days.  You can increase and decrease the dose based on your response.  Remember, it can take up to 3-4 days to take effect OR for the effects to wear off.   I often pair this with benefiber/citracel in the morning to help assure the stool is not too loose.   Message me if you want a banding and we can set this up.  Here is more information  Toileting tips to help with your constipation - Drink at least 64-80 ounces of water/liquid per day. - Establish a time to try to move your bowels every day.  For many people, this is after a cup of coffee or after a meal such as breakfast. - Sit all of the way back on the toilet keeping your back fairly straight and while sitting up, try to rest the tops of your forearms on your upper thighs.   - Raising your feet with a step stool/squatty potty can be helpful to improve the angle that allows your stool to pass through the rectum. - Relax the rectum feeling it bulge toward the toilet water.  If you feel your rectum raising toward your body, you are contracting rather than relaxing. - Breathe in and slowly exhale. "Belly breath" by expanding your belly towards your belly button. Keep belly expanded as you gently direct pressure down and back to the anus.  A low pitched GRRR sound can assist with increasing intra-abdominal pressure.  - Repeat 3-4 times. If unsuccessful, contract the pelvic floor to restore normal tone and get off the toilet.  Avoid excessive straining. - To reduce excessive wiping by teaching your anus to normally contract, place hands on outer aspect of knees and resist knee movement outward.  Hold 5-10 second then place hands just inside of knees and resist inward movement of  knees.  Hold 5 seconds.  Repeat a few times each way.  We may want to evaluate you for small intestinal bacterial overgrowth, this can cause increase gas, bloating, loose stools or constipation.  There is a test for this we can do or sometimes we will treat a patient with an antibiotic to see if it helps.     Please try low FODMAP diet- see below- start with eliminating just one column at a time, the table at the very bottom contains foods that are safe to take   FODMAP stands for fermentable oligo-, di-, mono-saccharides and polyols (1). These are the scientific terms used to classify groups of carbs that are notorious for triggering digestive symptoms like bloating, gas and stomach pain.     Probiotics are microorganisms which may be beneficial to the function of the gastrointestinal tract and are used in several different GI conditions including Irritable Bowel Syndrome and following infections involving the GI tract.  Two commonly used probiotics are:  VSL#3 (Bifidobacterium breve, B. longum, B. infantis, Lactobacillus acidophilus, L. plantarum, L. paracasei, L. bulgaricus, Streptococcus thermophilus) and Align (B. Infantis). If you want to try one of these probiotics helps.   Abdominal bloating and discomfort may be due to intestinal sensitivity or symptoms of irritable bowel syndrome. To relieve symptoms, avoid:  Broccoli  Baked beans  Cabbage  Carbonated drinks  Cauliflower  Chewing gum  Hard candy Abdominal distention resulting from weak abdominal muscles:  Is better in the morning  Gets worse as the day progresses  Is relieved by lying down Flatulence is gas created through bacterial action in the bowel and passed rectally. Keep in mind that:  10-18 passages per day are normal  Primary gases are harmless and odorless  Noticeable smells are trace gases related to food intake Foods to AVOID that are likely to form gas include:  Milk, dairy products, and medications that  contain lactose--If your body doesn't produce the enzyme (lactase) to break it down.  Certain vegetables--baked beans, cauliflower, broccoli, cabbage  Certain starches--wheat, oats, corn, potatoes. Rice is a good substitute. Identify offending foods. Reduce or eliminate these gas-forming foods from your diet. Can look at the FODMAP diet.    _______________________________________________________  If your blood pressure at your visit was 140/90 or greater, please contact your primary care physician to follow up on this.  _______________________________________________________  If you are age 64 or older, your body mass index should be between 23-30. Your Body mass index is 30.83 kg/m. If this is out of the aforementioned range listed, please consider follow up with your Primary Care Provider.  If you are age 64 or younger, your body mass index should be between 19-25. Your Body mass index is 30.83 kg/m. If this is out of the aformentioned range listed, please consider follow up with your Primary Care Provider.   ________________________________________________________  The [facility] providers would like to encourage you to use [facility] to communicate with providers for non-urgent requests or questions.  Due to long hold times on the telephone, sending your provider a message by [facility] may be a faster and more efficient way to get a response.  Please allow 48 business hours for a response.  Please remember that this is for non-urgent requests.  _______________________________________________________ It was a pleasure to see you today!  Thank you for trusting me with your gastrointestinal care!

## 2022-11-20 NOTE — Progress Notes (Signed)
I agree with the assessment and plan as outlined by Ms. Collier. 

## 2023-02-07 DIAGNOSIS — I10 Essential (primary) hypertension: Secondary | ICD-10-CM | POA: Diagnosis not present

## 2023-02-07 DIAGNOSIS — M7061 Trochanteric bursitis, right hip: Secondary | ICD-10-CM | POA: Diagnosis not present

## 2023-02-07 DIAGNOSIS — M6208 Separation of muscle (nontraumatic), other site: Secondary | ICD-10-CM | POA: Diagnosis not present

## 2023-02-07 DIAGNOSIS — R7309 Other abnormal glucose: Secondary | ICD-10-CM | POA: Diagnosis not present

## 2023-02-07 DIAGNOSIS — Z0000 Encounter for general adult medical examination without abnormal findings: Secondary | ICD-10-CM | POA: Diagnosis not present

## 2023-02-14 DIAGNOSIS — M6208 Separation of muscle (nontraumatic), other site: Secondary | ICD-10-CM | POA: Diagnosis not present

## 2023-02-14 DIAGNOSIS — N528 Other male erectile dysfunction: Secondary | ICD-10-CM | POA: Diagnosis not present

## 2023-02-14 DIAGNOSIS — J4520 Mild intermittent asthma, uncomplicated: Secondary | ICD-10-CM | POA: Diagnosis not present

## 2023-02-14 DIAGNOSIS — R7309 Other abnormal glucose: Secondary | ICD-10-CM | POA: Diagnosis not present

## 2024-04-15 DIAGNOSIS — Z125 Encounter for screening for malignant neoplasm of prostate: Secondary | ICD-10-CM | POA: Diagnosis not present

## 2024-04-15 DIAGNOSIS — Z0000 Encounter for general adult medical examination without abnormal findings: Secondary | ICD-10-CM | POA: Diagnosis not present

## 2024-04-22 DIAGNOSIS — Z1339 Encounter for screening examination for other mental health and behavioral disorders: Secondary | ICD-10-CM | POA: Diagnosis not present

## 2024-04-22 DIAGNOSIS — J4520 Mild intermittent asthma, uncomplicated: Secondary | ICD-10-CM | POA: Diagnosis not present

## 2024-04-22 DIAGNOSIS — Z6831 Body mass index (BMI) 31.0-31.9, adult: Secondary | ICD-10-CM | POA: Diagnosis not present

## 2024-04-22 DIAGNOSIS — Z1331 Encounter for screening for depression: Secondary | ICD-10-CM | POA: Diagnosis not present

## 2024-04-22 DIAGNOSIS — Z0000 Encounter for general adult medical examination without abnormal findings: Secondary | ICD-10-CM | POA: Diagnosis not present

## 2024-04-22 DIAGNOSIS — R140 Abdominal distension (gaseous): Secondary | ICD-10-CM | POA: Diagnosis not present
